# Patient Record
Sex: Male | Born: 1955 | Race: White | Hispanic: No | Marital: Single
Health system: Southern US, Community
[De-identification: ages and names within clinical notes are randomized; demographics above are authoritative.]

## PROBLEM LIST (undated history)

## (undated) DIAGNOSIS — J309 Allergic rhinitis, unspecified: Secondary | ICD-10-CM

## (undated) DIAGNOSIS — E119 Type 2 diabetes mellitus without complications: Secondary | ICD-10-CM

## (undated) DIAGNOSIS — M5430 Sciatica, unspecified side: Secondary | ICD-10-CM

## (undated) DIAGNOSIS — C801 Malignant (primary) neoplasm, unspecified: Secondary | ICD-10-CM

## (undated) DIAGNOSIS — M1990 Unspecified osteoarthritis, unspecified site: Secondary | ICD-10-CM

## (undated) HISTORY — PX: NASAL SEPTUM SURGERY: SHX37

## (undated) HISTORY — DX: Allergic rhinitis, unspecified: J30.9

## (undated) HISTORY — DX: Unspecified osteoarthritis, unspecified site: M19.90

## (undated) HISTORY — PX: BASAL CELL CARCINOMA EXCISION: SHX1214

## (undated) HISTORY — DX: Type 2 diabetes mellitus without complications: E11.9

## (undated) HISTORY — DX: Malignant (primary) neoplasm, unspecified: C80.1

## (undated) HISTORY — PX: RADIAL HEAD EXCISION: SHX757

---

## 1998-01-28 ENCOUNTER — Other Ambulatory Visit: Admission: RE | Admit: 1998-01-28 | Discharge: 1998-01-28 | Payer: Self-pay | Admitting: Otolaryngology

## 2010-09-08 ENCOUNTER — Ambulatory Visit: Payer: Self-pay | Admitting: Family Medicine

## 2010-09-08 ENCOUNTER — Encounter: Payer: Self-pay | Admitting: Family Medicine

## 2010-09-08 DIAGNOSIS — M1990 Unspecified osteoarthritis, unspecified site: Secondary | ICD-10-CM | POA: Insufficient documentation

## 2010-09-08 DIAGNOSIS — J309 Allergic rhinitis, unspecified: Secondary | ICD-10-CM | POA: Insufficient documentation

## 2010-09-08 HISTORY — DX: Unspecified osteoarthritis, unspecified site: M19.90

## 2010-09-08 HISTORY — DX: Allergic rhinitis, unspecified: J30.9

## 2010-09-08 LAB — CONVERTED CEMR LAB
Ketones, urine, test strip: NEGATIVE
Protein, U semiquant: NEGATIVE
Specific Gravity, Urine: 1.02
pH: 5

## 2010-09-09 LAB — CONVERTED CEMR LAB
Albumin: 4 g/dL (ref 3.5–5.2)
Alkaline Phosphatase: 84 units/L (ref 39–117)
BUN: 16 mg/dL (ref 6–23)
Basophils Absolute: 0 10*3/uL (ref 0.0–0.1)
CO2: 27 meq/L (ref 19–32)
Calcium: 8.8 mg/dL (ref 8.4–10.5)
Cholesterol: 188 mg/dL (ref 0–200)
Creatinine, Ser: 0.8 mg/dL (ref 0.4–1.5)
Eosinophils Absolute: 0.1 10*3/uL (ref 0.0–0.7)
GFR calc non Af Amer: 103.97 mL/min (ref >60)
Glucose, Bld: 98 mg/dL (ref 70–99)
HDL: 30.3 mg/dL — ABNORMAL LOW (ref >39.00)
Hemoglobin: 14.3 g/dL (ref 13.0–17.0)
Lymphocytes Relative: 31.6 % (ref 12.0–46.0)
MCHC: 34.6 g/dL (ref 30.0–36.0)
Monocytes Relative: 7.2 % (ref 3.0–12.0)
Neutro Abs: 3.3 10*3/uL (ref 1.4–7.7)
Platelets: 216 10*3/uL (ref 150.0–400.0)
RDW: 13.5 % (ref 11.5–14.6)
TSH: 2.21 microintl units/mL (ref 0.35–5.50)
Total Bilirubin: 0.8 mg/dL (ref 0.3–1.2)
Triglycerides: 130 mg/dL (ref 0.0–149.0)
VLDL: 26 mg/dL (ref 0.0–40.0)

## 2010-09-11 ENCOUNTER — Encounter: Payer: Self-pay | Admitting: Family Medicine

## 2010-09-16 ENCOUNTER — Encounter (INDEPENDENT_AMBULATORY_CARE_PROVIDER_SITE_OTHER): Payer: Self-pay | Admitting: *Deleted

## 2010-10-14 ENCOUNTER — Encounter (INDEPENDENT_AMBULATORY_CARE_PROVIDER_SITE_OTHER): Payer: Self-pay | Admitting: *Deleted

## 2010-10-15 ENCOUNTER — Ambulatory Visit
Admission: RE | Admit: 2010-10-15 | Discharge: 2010-10-15 | Payer: Self-pay | Source: Home / Self Care | Attending: Gastroenterology | Admitting: Gastroenterology

## 2010-10-27 ENCOUNTER — Ambulatory Visit
Admission: RE | Admit: 2010-10-27 | Discharge: 2010-10-27 | Payer: Self-pay | Source: Home / Self Care | Attending: Gastroenterology | Admitting: Gastroenterology

## 2010-10-27 ENCOUNTER — Encounter: Payer: Self-pay | Admitting: Gastroenterology

## 2010-11-04 NOTE — Letter (Signed)
Summary: PATIENT HX FORM  PATIENT HX FORM   Imported By: Georgian Co 09/11/2010 09:35:06  _____________________________________________________________________  External Attachment:    Type:   Image     Comment:   External Document

## 2010-11-04 NOTE — Assessment & Plan Note (Signed)
Summary: new pt/pt is requesting cpx/pt will come in fasting/njr   Vital Signs:  Patient profile:   55 year old male Weight:      261 pounds O2 Sat:      97 % on Room air Temp:     98.6 degrees F oral Pulse rate:   63 / minute BP sitting:   144 / 86  (left arm) Cuff size:   regular  Vitals Entered By: Bill Salinas CMA (September 08, 2010 9:07 AM)  O2 Flow:  Room air CC: New pt here to est care with primary/ ab   History of Present Illness: 55 yr old male to establish with [place] and for a cpx. It has been several years since he has seen a primary care doctor. He sees Dr. Yetta Barre every 6 months, as well as his dentist. He feels fine today and has no concerns.   Preventive Screening-Counseling & Management  Alcohol-Tobacco     Alcohol drinks/day: 0     Smoking Status: never  Current Medications (verified): 1)  Aspirin 325 Mg Tabs (Aspirin) .Marland Kitchen.. 1 Tablet Daily 2)  Zyrtec Hives Relief 10 Mg Tabs (Cetirizine Hcl) .Marland Kitchen.. 1 Tablet Daily 3)  Guaifenesin 200 Mg Tabs (Guaifenesin) .Marland Kitchen.. 1 Tablet Daily  Allergies (verified): No Known Drug Allergies  Past History:  Past Medical History: Allergic rhinitis Osteoarthritis sees Dr. 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 Family History of Arthritis Family History Diabetes 1st degree relative Family History Lung cancer Family History of Prostate CA 1st degree relative <50 father had Parkinsons disease, died at age 20  Social History: Reviewed history and no changes required. Occupation: Airline pilot Married Never Smoked Alcohol use-no Occupation:  employed Smoking Status:  never  Review of Systems  The patient denies anorexia, fever, weight loss, weight gain,  vision loss, decreased hearing, hoarseness, chest pain, syncope, dyspnea on exertion, peripheral edema, prolonged cough, headaches, hemoptysis, abdominal pain, melena, hematochezia, severe indigestion/heartburn, hematuria, incontinence, genital sores, muscle weakness, suspicious skin lesions, transient blindness, difficulty walking, depression, unusual weight change, abnormal bleeding, enlarged lymph nodes, angioedema, breast masses, and testicular masses.    Physical Exam  General:  overweight-appearing.   Head:  Normocephalic and atraumatic without obvious abnormalities. No apparent alopecia or balding. Eyes:  No corneal or conjunctival inflammation noted. EOMI. Perrla. Funduscopic exam benign, without hemorrhages, exudates or papilledema. Vision grossly normal. Ears:  External ear exam shows no significant lesions or deformities.  Otoscopic examination reveals clear canals, tympanic membranes are intact bilaterally without bulging, retraction, inflammation or discharge. Hearing is grossly normal bilaterally. Nose:  External nasal examination shows no deformity or inflammation. Nasal mucosa are pink and moist without lesions or exudates. Mouth:  Oral mucosa and oropharynx without lesions or exudates.  Teeth in good repair. Neck:  No deformities, masses, or tenderness noted. Chest Wall:  No deformities, masses, tenderness or gynecomastia noted. Lungs:  Normal respiratory effort, chest expands symmetrically. Lungs are clear to auscultation, no crackles or wheezes. Heart:  Normal rate and regular rhythm. S1 and S2 normal without gallop, murmur, click, rub or other extra sounds. EKG normal Abdomen:  Bowel sounds positive,abdomen soft and non-tender without masses, organomegaly or hernias noted. Rectal:  No external abnormalities noted. Normal  sphincter tone. No rectal masses or tenderness. heme neg. Genitalia:  Testes bilaterally descended without nodularity, tenderness or masses. No scrotal masses or  lesions. No penis lesions or urethral discharge. Prostate:  no nodules, no asymmetry, no induration, and 1+ enlarged.   Msk:  No deformity or scoliosis noted of thoracic or lumbar spine.   Pulses:  R and L carotid,radial,femoral,dorsalis pedis and posterior tibial pulses are full and equal bilaterally Extremities:  No clubbing, cyanosis, edema, or deformity noted with normal full range of motion of all joints.   Neurologic:  No cranial nerve deficits noted. Station and gait are normal. Plantar reflexes are down-going bilaterally. DTRs are symmetrical throughout. Sensory, motor and coordinative functions appear intact. Skin:  Intact without suspicious lesions or rashes Cervical Nodes:  No lymphadenopathy noted Axillary Nodes:  No palpable lymphadenopathy Inguinal Nodes:  No significant adenopathy Psych:  Cognition and judgment appear intact. Alert and cooperative with normal attention span and concentration. No apparent delusions, illusions, hallucinations   Impression & Recommendations:  Problem # 1:  HEALTH MAINTENANCE EXAM (ICD-V70.0)  Orders: UA Dipstick w/o Micro (automated)  (81003) Hemoccult Guaiac-1 spec.(in office) (82270) EKG w/ Interpretation (93000) Venipuncture (09811) TLB-Lipid Panel (80061-LIPID) TLB-BMP (Basic Metabolic Panel-BMET) (80048-METABOL) TLB-CBC Platelet - w/Differential (85025-CBCD) TLB-Hepatic/Liver Function Pnl (80076-HEPATIC) TLB-TSH (Thyroid Stimulating Hormone) (84443-TSH) TLB-PSA (Prostate Specific Antigen) (84153-PSA) Specimen Handling (91478)  Complete Medication List: 1)  Aspirin 325 Mg Tabs (Aspirin) .Marland Kitchen.. 1 tablet daily 2)  Zyrtec Hives Relief 10 Mg Tabs (Cetirizine hcl) .Marland Kitchen.. 1 tablet daily 3)  Guaifenesin 200 Mg Tabs (Guaifenesin) .Marland Kitchen.. 1 tablet daily  Other Orders: Admin 1st Vaccine (29562) Flu Vaccine 38yrs + (13086)  Patient Instructions: 1)  It is important that you exercise reguarly at least 20 minutes 5 times a week. If you develop  chest pain, have severe difficulty breathing, or feel very tired, stop exercising immediately and seek medical attention.  2)  You need to lose weight. Consider a lower calorie diet and regular exercise.  3)  Schedule a colonoscopy/ sigmoidoscopy to help detect colon cancer.  4)  get fasting labs    Orders Added: 1)  New Patient 40-64 years [99386] 2)  UA Dipstick w/o Micro (automated)  [81003] 3)  Hemoccult Guaiac-1 spec.(in office) [82270] 4)  EKG w/ Interpretation [93000] 5)  Venipuncture [36415] 6)  TLB-Lipid Panel [80061-LIPID] 7)  TLB-BMP (Basic Metabolic Panel-BMET) [80048-METABOL] 8)  TLB-CBC Platelet - w/Differential [85025-CBCD] 9)  TLB-Hepatic/Liver Function Pnl [80076-HEPATIC] 10)  TLB-TSH (Thyroid Stimulating Hormone) [84443-TSH] 11)  TLB-PSA (Prostate Specific Antigen) [57846-NGE] 12)  Specimen Handling [99000] 13)  Admin 1st Vaccine [90471] 14)  Flu Vaccine 64yrs + [95284]   Flu Vaccine Consent Questions     Do you have a history of severe allergic reactions to this vaccine? no    Any prior history of allergic reactions to egg and/or gelatin? no    Do you have a sensitivity to the preservative Thimersol? no    Do you have a past history of Guillan-Barre Syndrome? no    Do you currently have an acute febrile illness? no    Have you ever had a severe reaction to latex? no    Vaccine information given and explained to patient? yes    Are you currently pregnant? no    Lot Number:[id number]   Exp Date:04/04/2011   Site Given  Left Deltoid IM  Influenza Vaccine (to be given today)          .lbflu Laboratory Results  Urine Tests    Routine Urinalysis   Color: yellow Appearance: Clear Glucose: negative   (Normal Range: Negative) Bilirubin: negative   (Normal Range: Negative) Ketone: negative   (Normal Range: Negative) Spec. Gravity: 1.020   (Normal Range: 1.003-1.035) Blood: trace-intact   (Normal Range: Negative) pH: 5.0   (Normal Range:  5.0-8.0) Protein: negative   (Normal Range: Negative) Urobilinogen: 0.2   (Normal Range: 0-1) Nitrite: negative   (Normal Range: Negative) Leukocyte Esterace: negative   (Normal Range: Negative)    Comments: Rita Ohara  September 08, 2010 2:00 PM      Appended Document: colonscopy referral     Clinical Lists Changes  Orders: Added new Referral order of Gastroenterology Referral (GI) - Signed

## 2010-11-06 NOTE — Procedures (Signed)
Summary: Colonoscopy  Patient: Paul Estes Note: All result statuses are Final unless otherwise noted.  Tests: (1) Colonoscopy (COL)   COL Colonoscopy           DONE     [facility]     [address]     [place], [place]  [address]           COLONOSCOPY PROCEDURE REPORT           PATIENT:  [name], [name]  MR#:  [id number]     BIRTHDATE:  07-Feb-1956, 54 yrs. old  GENDER:  male     ENDOSCOPIST:  Vania Rea. Jarold Motto, MD, [facility]     REF. BY:  Tera Mater. Clent Ridges, M.D.     PROCEDURE DATE:  10/27/2010     PROCEDURE:  Average-risk screening colonoscopy     G0121     ASA CLASS:  Class I     INDICATIONS:  family Hx of polyps     MEDICATIONS:   Fentanyl 75 mcg IV, Versed 7 mg IV           DESCRIPTION OF PROCEDURE:   After the risks benefits and     alternatives of the procedure were thoroughly explained, informed     consent was obtained.  Digital rectal exam was performed and     revealed no abnormalities.   The LB 180AL [id number] endoscope was     introduced through the anus and advanced to the cecum, which was     identified by both the appendix and ileocecal valve, without     limitations.  The quality of the prep was excellent, using     MoviPrep.  The instrument was then slowly withdrawn as the colon     was fully examined.     <<PROCEDUREIMAGES>>           FINDINGS:  Scattered diverticula were found in the sigmoid colon.     No polyps or cancers were seen.  This was otherwise a normal     examination of the colon.   Retroflexed views in the rectum     revealed no abnormalities.    The scope was then withdrawn from     the patient and the procedure completed.           COMPLICATIONS:  None     ENDOSCOPIC IMPRESSION:     1) Diverticula, scattered in the sigmoid colon     2) No polyps or cancers     3) Otherwise normal examination     RECOMMENDATIONS:     1) Continue current colorectal screening recommendations for     "routine risk" patients with a repeat colonoscopy in 10  years.     REPEAT EXAM:  No           ______________________________     Vania Rea. Jarold Motto, MD, [facility]           CC:           n.     eSIGNED:   Vania Rea. Jeniah Kishi at 10/27/2010 02:34 PM           Leroy Kennedy, [id number]  Note: An exclamation mark (!) indicates a result that was not dispersed into the flowsheet. Document Creation Date: 10/27/2010 2:34 PM _______________________________________________________________________  (1) Order result status: Final Collection or observation date-time: 10/27/2010 14:25 Requested date-time:  Receipt date-time:  Reported date-time:  Referring Physician:   Ordering Physician: Sheryn Bison [phone]) Specimen Source:  Source:  Launa Grill Order Number: [id number] Lab site:   Appended Document: Colonoscopy    Clinical Lists Changes  Observations: Added new observation of COLONNXTDUE: 10/2020 (10/27/2010 16:47)

## 2010-11-06 NOTE — Letter (Signed)
Summary: Pre Visit Letter Revised  [facility]  [address] [place], [place] [address]   Phone: [phone]  Fax: [phone]        09/16/2010 MRN: [id number] Paul Estes [address] [place], [place]  [address]             Procedure Date:  10/27/2010   Welcome to the Gastroenterology Division at [facility].    You are scheduled to see a nurse for your pre-procedure visit on 10/15/2010 at 9:00AM on the 3rd floor at [facility], [address]  We ask that you try to arrive at our office 15 minutes prior to your appointment time to allow for check-in.  Please take a minute to review the attached form.  If you answer "Yes" to one or more of the questions on the first page, we ask that you call the person listed at your earliest opportunity.  If you answer "No" to all of the questions, please complete the rest of the form and bring it to your appointment.    Your nurse visit will consist of discussing your medical and surgical history, your immediate family medical history, and your medications.   If you are unable to list all of your medications on the form, please bring the medication bottles to your appointment and we will list them.  We will need to be aware of both prescribed and over the counter drugs.  We will need to know exact dosage information as well.    Please be prepared to read and sign documents such as consent forms, a financial agreement, and acknowledgement forms.  If necessary, and with your consent, a friend or relative is welcome to sit-in on the nurse visit with you.  Please bring your insurance card so that we may make a copy of it.  If your insurance requires a referral to see a specialist, please bring your referral form from your primary care physician.  No co-pay is required for this nurse visit.     If you cannot keep your appointment, please call [phone] to cancel or reschedule prior to your appointment date.  This allows  [place] the opportunity to schedule an appointment for another patient in need of care.    Thank you for choosing [facility] for your medical needs.  We appreciate the opportunity to care for you.  Please visit [place] at our website  to learn more about our practice.  Sincerely, The Gastroenterology Division

## 2010-11-06 NOTE — Letter (Signed)
Summary: [facility] Instructions  [facility]  [address] [place], [place] [address]   Phone: [phone]  Fax: [phone]       Paul Estes    [birth year]    MRN: [id number]        Procedure Day Dorna Bloom: Duanne Limerick 10/27/10     Arrival Time: 12:30pm     Procedure Time: 1:30pm     Location of Procedure:                    _X _  [facility] (4th Floor)                        PREPARATION FOR COLONOSCOPY WITH MOVIPREP   Starting 5 days prior to your procedure  [facility] 01/18  do not eat nuts, seeds, popcorn, corn, beans, peas,  salads, or any raw vegetables.  Do not take any fiber supplements (e.g. Metamucil, Citrucel, and Benefiber).  THE DAY BEFORE YOUR PROCEDURE         DATE: SUNDAY 01/22  1.  Drink clear liquids the entire day-NO SOLID FOOD  2.  Do not drink anything colored red or purple.  Avoid juices with pulp.  No orange juice.  3.  Drink at least 64 oz. (8 glasses) of fluid/clear liquids during the day to prevent dehydration and help the prep work efficiently.  CLEAR LIQUIDS INCLUDE: Water Jello Ice Popsicles Tea (sugar ok, no milk/cream) Powdered fruit flavored drinks Coffee (sugar ok, no milk/cream) Gatorade Juice: apple, white grape, white cranberry  Lemonade Clear bullion, consomm, broth Carbonated beverages (any kind) Strained chicken noodle soup Hard Candy                             4.  In the morning, mix first dose of MoviPrep solution:    Empty 1 Pouch A and 1 Pouch B into the disposable container    Add lukewarm drinking water to the top line of the container. Mix to dissolve    Refrigerate (mixed solution should be used within 24 hrs)  5.  Begin drinking the prep at 5:00 p.m. The MoviPrep container is divided by 4 marks.   Every 15 minutes drink the solution down to the next mark (approximately 8 oz) until the full liter is complete.   6.  Follow completed prep with 16 oz of clear liquid of your choice (Nothing  red or purple).  Continue to drink clear liquids until bedtime.  7.  Before going to bed, mix second dose of MoviPrep solution:    Empty 1 Pouch A and 1 Pouch B into the disposable container    Add lukewarm drinking water to the top line of the container. Mix to dissolve    Refrigerate  THE DAY OF YOUR PROCEDURE      DATE: MONDAY  01/23  Beginning at  8:30a.m. (5 hours before procedure):         1. Every 15 minutes, drink the solution down to the next mark (approx 8 oz) until the full liter is complete.  2. Follow completed prep with 16 oz. of clear liquid of your choice.    3. You may drink clear liquids until 11:30am  (2 HOURS BEFORE PROCEDURE).   MEDICATION INSTRUCTIONS  Unless otherwise instructed, you should take regular prescription medications with a small sip of water   as early as possible the morning of your  procedure.           OTHER INSTRUCTIONS  You will need a responsible adult at least 55 years of age to accompany you and drive you home.   This person must remain in the waiting room during your procedure.  Wear loose fitting clothing that is easily removed.  Leave jewelry and other valuables at home.  However, you may wish to bring a book to read or  an iPod/MP3 player to listen to music as you wait for your procedure to start.  Remove all body piercing jewelry and leave at home.  Total time from sign-in until discharge is approximately 2-3 hours.  You should go home directly after your procedure and rest.  You can resume normal activities the  day after your procedure.  The day of your procedure you should not:   Drive   Make legal decisions   Operate machinery   Drink alcohol   Return to work  You will receive specific instructions about eating, activities and medications before you leave.    The above instructions have been reviewed and explained to me by   Ezra Sites RN  October 15, 2010 9:19 AM     I fully understand and can  verbalize these instructions _____________________________ Date _________

## 2010-11-06 NOTE — Miscellaneous (Signed)
Summary: [facility]  Clinical Lists Changes  Medications: Added new medication of MOVIPREP 100 GM  SOLR (PEG-KCL-NACL-NASULF-NA ASC-C) As per prep instructions. - Signed Rx of MOVIPREP 100 GM  SOLR (PEG-KCL-NACL-NASULF-NA ASC-C) As per prep instructions.;  #1 x 0;  Signed;  Entered by: Ezra Sites RN;  Authorized by: Mardella Layman MD [facility];  Method used: Electronically to [facility]*, [address], [place], [place]  [id number], Ph: [phone], Fax: [phone] Observations: Added new observation of NKA: T (10/15/2010 8:53)    Prescriptions: MOVIPREP 100 GM  SOLR (PEG-KCL-NACL-NASULF-NA ASC-C) As per prep instructions.  #1 x 0   Entered by:   Ezra Sites RN   Authorized by:   Mardella Layman MD [facility]   Signed by:   Ezra Sites RN on 10/15/2010   Method used:   Electronically to        [facility]* (retail)       [address]       [place], [place]  [id number]       Ph: [phone]       Fax: [phone]   RxID:   [id number]

## 2011-10-06 HISTORY — PX: SQUAMOUS CELL CARCINOMA EXCISION: SHX2433

## 2012-04-13 ENCOUNTER — Telehealth: Payer: Self-pay | Admitting: Internal Medicine

## 2012-04-13 NOTE — Telephone Encounter (Signed)
Caller: Paul Estes/Patient; PCP: Darryll Capers; CB#: [phone]; Calling today 04/13/12 regarding has bug bite on right buttock.  Onset 04/11/12.  Has purple area with dot in the center about 2 inches in diameter.  Area burns, does not itch.  Area is sore (pt does not think it is a boil).  Afebrile.  Emergent symptoms r/o by Bites and Stings - Insect or Spider guidelines with exception of painful bites.  Appt scheduled for tomorrow 04/14/12 at 2 PM with Jolaine Artist, FNP.  Care advice given.

## 2012-04-14 ENCOUNTER — Ambulatory Visit (INDEPENDENT_AMBULATORY_CARE_PROVIDER_SITE_OTHER): Payer: BC Managed Care – PPO | Admitting: Family

## 2012-04-14 ENCOUNTER — Encounter: Payer: Self-pay | Admitting: Family

## 2012-04-14 VITALS — BP 112/78 | Temp 99.2°F | Ht 73.0 in | Wt 272.0 lb

## 2012-04-14 DIAGNOSIS — M7918 Myalgia, other site: Secondary | ICD-10-CM

## 2012-04-14 DIAGNOSIS — IMO0001 Reserved for inherently not codable concepts without codable children: Secondary | ICD-10-CM

## 2012-04-14 DIAGNOSIS — L0390 Cellulitis, unspecified: Secondary | ICD-10-CM

## 2012-04-14 DIAGNOSIS — L0291 Cutaneous abscess, unspecified: Secondary | ICD-10-CM

## 2012-04-14 MED ORDER — SULFAMETHOXAZOLE-TRIMETHOPRIM 800-160 MG PO TABS: 1.0000 | ORAL_TABLET | Freq: Two times a day (BID) | ORAL | Status: AC

## 2012-04-14 NOTE — Progress Notes (Signed)
  Subjective:    Patient ID: Paul Estes, male    DOB: 20-Apr-1956, 56 y.o.   MRN: [id number]  HPI 56 year old white male, nonsmoker, patient of Dr. Clent Ridges is in today with complaints of a possible insect bite to his right buttocks that he noticed about 3-4 days ago. He describes it as red, tender to touch. Denies any drainage or discharge from the area. Denies fever.   Review of Systems  Constitutional: Negative.   Respiratory: Negative.   Cardiovascular: Negative.   Skin:       Has had bites of the right buttocks, red and tender  Neurological: Negative.   Psychiatric/Behavioral: Negative.    No past medical history on file.  History   Social History  . Marital Status: Married    Spouse Name: N/A    Number of Children: N/A  . Years of Education: N/A   Occupational History  . Not on file.   Social History Main Topics  . Smoking status: Never Smoker   . Smokeless tobacco: Not on file  . Alcohol Use: Yes  . Drug Use: No  . Sexually Active: Not on file   Other Topics Concern  . Not on file   Social History Narrative  . No narrative on file    No past surgical history on file.  No family history on file.  No Known Allergies  Current Outpatient Prescriptions on File Prior to Visit  Medication Sig Dispense Refill  . cetirizine (ZYRTEC) 10 MG tablet Take 10 mg by mouth daily.        BP 112/78  Temp 99.2 F (37.3 C) (Oral)  Ht 6\' 1"  (1.854 m)  Wt 272 lb (123.378 kg)  BMI 35.89 kg/m2chart    Objective:   Physical Exam  Constitutional: He is oriented to person, place, and time. He appears well-developed and well-nourished.  Cardiovascular: Normal rate, regular rhythm and normal heart sounds.   Pulmonary/Chest: Effort normal and breath sounds normal.  Neurological: He is alert and oriented to person, place, and time.  Skin: Skin is warm and dry.       Red, tender, warm area noted to the right buttocks. No drainage or discharge. No abscess. Consistent with  cellulitis.  Psychiatric: He has a normal mood and affect.          Assessment & Plan:  Assessment: Cellulitis, possible insect bite  Plan: Bactrim DS 1 tablet twice a day x7 days. Patient call the office if symptoms worsen or persist. Recheck a schedule, and when necessary.

## 2012-04-14 NOTE — Patient Instructions (Addendum)

## 2012-05-12 ENCOUNTER — Encounter: Payer: Self-pay | Admitting: Family Medicine

## 2012-05-12 ENCOUNTER — Other Ambulatory Visit (INDEPENDENT_AMBULATORY_CARE_PROVIDER_SITE_OTHER): Payer: BC Managed Care – PPO

## 2012-05-12 DIAGNOSIS — Z0000 Encounter for general adult medical examination without abnormal findings: Secondary | ICD-10-CM

## 2012-05-12 LAB — CBC WITH DIFFERENTIAL/PLATELET
Eosinophils Relative: 1.2 % (ref 0.0–5.0)
HCT: 41.4 % (ref 39.0–52.0)
Hemoglobin: 13.8 g/dL (ref 13.0–17.0)
Lymphs Abs: 1.6 10*3/uL (ref 0.7–4.0)
MCV: 90.9 fl (ref 78.0–100.0)
Monocytes Relative: 6.8 % (ref 3.0–12.0)
Neutro Abs: 3.5 10*3/uL (ref 1.4–7.7)
WBC: 5.5 10*3/uL (ref 4.5–10.5)

## 2012-05-12 LAB — LIPID PANEL
Cholesterol: 184 mg/dL (ref 0–200)
Total CHOL/HDL Ratio: 6
Triglycerides: 203 mg/dL — ABNORMAL HIGH (ref 0.0–149.0)
VLDL: 40.6 mg/dL — ABNORMAL HIGH (ref 0.0–40.0)

## 2012-05-12 LAB — POCT URINALYSIS DIPSTICK
Bilirubin, UA: NEGATIVE
Blood, UA: NEGATIVE
Leukocytes, UA: NEGATIVE
Nitrite, UA: NEGATIVE
Protein, UA: NEGATIVE
Urobilinogen, UA: 0.2
pH, UA: 7

## 2012-05-12 LAB — BASIC METABOLIC PANEL
Chloride: 104 mEq/L (ref 96–112)
GFR: 97.8 mL/min (ref >60.00)
Glucose, Bld: 98 mg/dL (ref 70–99)
Potassium: 4.3 mEq/L (ref 3.5–5.1)
Sodium: 138 mEq/L (ref 135–145)

## 2012-05-12 LAB — HEPATIC FUNCTION PANEL
ALT: 21 U/L (ref 0–53)
Bilirubin, Direct: 0.1 mg/dL (ref 0.0–0.3)
Total Bilirubin: 0.8 mg/dL (ref 0.3–1.2)

## 2012-05-16 NOTE — Progress Notes (Signed)
Quick Note:  Attempt to call- VM - LMTCB if questions - gave results and dr. Claris Che instructions ______

## 2012-05-18 ENCOUNTER — Encounter: Payer: Self-pay | Admitting: Family Medicine

## 2012-05-18 ENCOUNTER — Ambulatory Visit (INDEPENDENT_AMBULATORY_CARE_PROVIDER_SITE_OTHER): Payer: BC Managed Care – PPO | Admitting: Family Medicine

## 2012-05-18 VITALS — BP 128/78 | HR 82 | Temp 98.4°F | Resp 20 | Ht 72.5 in | Wt 270.0 lb

## 2012-05-18 DIAGNOSIS — Z0000 Encounter for general adult medical examination without abnormal findings: Secondary | ICD-10-CM

## 2012-05-18 NOTE — Progress Notes (Signed)
  Subjective:    Patient ID: Paul Estes, male    DOB: [birth year], 56 y.o.   MRN: [id number]  HPI 56 yr old male for a cpx. He feels fine and has no concerns except for his weight. He exercises but admits that his diet is not very healthy. He has actually arranged to meet with a Nutritionist starting next week to help with this. His recent labs revealed a low HDL.   Review of Systems  Constitutional: Negative.   HENT: Negative.   Eyes: Negative.   Respiratory: Negative.   Cardiovascular: Negative.   Gastrointestinal: Negative.   Genitourinary: Negative.   Musculoskeletal: Negative.   Skin: Negative.   Neurological: Negative.   Hematological: Negative.   Psychiatric/Behavioral: Negative.        Objective:   Physical Exam  Constitutional: He is oriented to person, place, and time. He appears well-developed and well-nourished. No distress.  HENT:  Head: Normocephalic and atraumatic.  Right Ear: External ear normal.  Left Ear: External ear normal.  Nose: Nose normal.  Mouth/Throat: Oropharynx is clear and moist. No oropharyngeal exudate.  Eyes: Conjunctivae and EOM are normal. Pupils are equal, round, and reactive to light. Right eye exhibits no discharge. Left eye exhibits no discharge. No scleral icterus.  Neck: Neck supple. No JVD present. No tracheal deviation present. No thyromegaly present.  Cardiovascular: Normal rate, regular rhythm, normal heart sounds and intact distal pulses.  Exam reveals no gallop and no friction rub.   No murmur heard.      EKG normal   Pulmonary/Chest: Effort normal and breath sounds normal. No respiratory distress. He has no wheezes. He has no rales. He exhibits no tenderness.  Abdominal: Soft. Bowel sounds are normal. He exhibits no distension and no mass. There is no tenderness. There is no rebound and no guarding.  Genitourinary: Rectum normal, prostate normal and penis normal. Guaiac negative stool. No penile tenderness.  Musculoskeletal: Normal  range of motion. He exhibits no edema and no tenderness.  Lymphadenopathy:    He has no cervical adenopathy.  Neurological: He is alert and oriented to person, place, and time. He has normal reflexes. No cranial nerve deficit. He exhibits normal muscle tone. Coordination normal.  Skin: Skin is warm and dry. No rash noted. He is not diaphoretic. No erythema. No pallor.  Psychiatric: He has a normal mood and affect. His behavior is normal. Judgment and thought content normal.          Assessment & Plan:  Well exam. He will meet with the Nutritionist as above. Advised him to take OTC fish oil capsules daily.

## 2013-11-09 DIAGNOSIS — B351 Tinea unguium: Secondary | ICD-10-CM

## 2014-04-09 ENCOUNTER — Encounter: Payer: Self-pay | Admitting: Family Medicine

## 2014-04-09 ENCOUNTER — Ambulatory Visit (INDEPENDENT_AMBULATORY_CARE_PROVIDER_SITE_OTHER): Payer: BC Managed Care – PPO | Admitting: Family Medicine

## 2014-04-09 VITALS — BP 141/84 | HR 69 | Temp 99.3°F | Ht 72.5 in | Wt 270.0 lb

## 2014-04-09 DIAGNOSIS — N63 Unspecified lump in unspecified breast: Secondary | ICD-10-CM

## 2014-04-09 NOTE — Progress Notes (Signed)
Pre visit review using our clinic review tool, if applicable. No additional management support is needed unless otherwise documented below in the visit note. 

## 2014-04-09 NOTE — Progress Notes (Signed)
   Subjective:    Patient ID: Paul Estes, male    DOB: Feb 23, 1956, 58 y.o.   MRN: [id number]  HPI Here for 2 months of swelling in the left breast. It is slightly tender but no much. No nipple DC. No lumps under the arm. He saw Urgent Care 10 days ago and was put on Keflex, but this did not help. He has not taken any herbs or supplements OTC.    Review of Systems  Constitutional: Negative.   Respiratory: Negative.   Cardiovascular: Negative.   Hematological: Negative for adenopathy.       Objective:   Physical Exam  Constitutional: He appears well-developed and well-nourished.  Pulmonary/Chest:  The left breast is a little swollen and has a density present beneath the nipple, about 3-4 cm in diameter. No axillary nodes          Assessment & Plan:  Unilateral breast swelling. Set up a mammogram and [place] soon.

## 2014-04-19 ENCOUNTER — Ambulatory Visit
Admission: RE | Admit: 2014-04-19 | Discharge: 2014-04-19 | Disposition: A | Discharge status: Home / Self Care | Payer: BC Managed Care – PPO | Source: Ambulatory Visit | Attending: Family Medicine | Admitting: Family Medicine

## 2014-04-19 DIAGNOSIS — N63 Unspecified lump in unspecified breast: Secondary | ICD-10-CM

## 2016-07-20 ENCOUNTER — Encounter: Payer: Self-pay | Admitting: Family Medicine

## 2017-03-29 ENCOUNTER — Ambulatory Visit (INDEPENDENT_AMBULATORY_CARE_PROVIDER_SITE_OTHER): Payer: PRIVATE HEALTH INSURANCE | Admitting: Podiatry

## 2017-03-29 ENCOUNTER — Ambulatory Visit (INDEPENDENT_AMBULATORY_CARE_PROVIDER_SITE_OTHER): Payer: PRIVATE HEALTH INSURANCE

## 2017-03-29 ENCOUNTER — Encounter: Payer: Self-pay | Admitting: Podiatry

## 2017-03-29 VITALS — BP 142/83 | HR 74 | Resp 16 | Ht 72.0 in | Wt 270.0 lb

## 2017-03-29 DIAGNOSIS — M722 Plantar fascial fibromatosis: Secondary | ICD-10-CM

## 2017-03-29 MED ORDER — TRIAMCINOLONE ACETONIDE 10 MG/ML IJ SUSP: 10.0000 mg | Freq: Once | INTRAMUSCULAR | Status: DC

## 2017-03-29 MED ORDER — DICLOFENAC SODIUM 75 MG PO TBEC: 75.0000 mg | DELAYED_RELEASE_TABLET | Freq: Two times a day (BID) | ORAL | 2 refills | Status: DC

## 2017-03-29 NOTE — Patient Instructions (Signed)

## 2017-03-29 NOTE — Progress Notes (Signed)
Subjective:    Patient ID: Paul Estes, male   DOB: 61 y.o.   MRN: [id number]   HPI patient presents stating she has a lot of pain in her right heel at the insertional point of the tendon into the calcaneus and states it's been going on for at least 6 months and he no longer is active as he used to be any needs to lose weight    Review of Systems  All other systems reviewed and are negative.       Objective:  Physical Exam  Cardiovascular: Intact distal pulses.   Musculoskeletal: Normal range of motion.  Neurological: He is alert.  Skin: Skin is warm.  Nursing note and vitals reviewed.  neurovascular status found to be intact with muscle strength adequate range of motion within normal limits with patient found to have exquisite discomfort at the plantar aspect of the right heel insertional point tendon into the calcaneus. Patient is found to have good digital perfusion is well oriented 3 with moderate depression of the arch     Assessment:    Acute plantar fasciitis right with also long-term history of condition which indicates there is a chronic element to this     Plan:  H&P condition reviewed and injected the right plantar fascia 3 mg Kenalog 5 mg Xylocaine and applied fascial brace to lift the arch. I then placed on diclofenac 75 mg twice a day instructed on supportive shoes and gave instructions on physical therapy. Discussed long-term orthotics and reappoint to recheck  X-rays indicate small spur with no indication stress fracture arthritis

## 2017-03-29 NOTE — Progress Notes (Signed)
   Subjective:    Patient ID: Paul Estes, male    DOB: 12-14-1955, 61 y.o.   MRN: [id number]  HPI Chief Complaint  Patient presents with  . Foot Pain    Right foot; bottom of heel & arch; x8 months      Review of Systems  Musculoskeletal: Positive for gait problem.  All other systems reviewed and are negative.      Objective:   Physical Exam        Assessment & Plan:

## 2017-04-12 ENCOUNTER — Ambulatory Visit (INDEPENDENT_AMBULATORY_CARE_PROVIDER_SITE_OTHER): Payer: PRIVATE HEALTH INSURANCE | Admitting: Podiatry

## 2017-04-12 ENCOUNTER — Encounter: Payer: Self-pay | Admitting: Podiatry

## 2017-04-12 DIAGNOSIS — M722 Plantar fascial fibromatosis: Secondary | ICD-10-CM

## 2017-04-12 MED ORDER — TRIAMCINOLONE ACETONIDE 10 MG/ML IJ SUSP
10.0000 mg | Freq: Once | INTRAMUSCULAR | Status: AC
  Administered 2017-04-12: 10 mg

## 2017-04-13 NOTE — Progress Notes (Signed)
Subjective:    Patient ID: Paul Estes, male   DOB: 61 y.o.   MRN: [id number]   HPI patient states the foot is improved but after being on it all day it starts to get sore again and it's still sore in the heel    ROS      Objective:  Physical Exam neurovascular status intact with continued inflammation pain in the right plantar heel at the insertional point of the tendon into the calcaneus with fluid buildup around the medial band     Assessment:   Plantar fasciitis right with inflammation fluid buildup still noted around the medial band with depression of the arch noted      Plan:   H&P x-rays reviewed and today I reinjected the plantar fascia 3 Milligan Kenalog 5 mill grams Xylocaine and then went ahead and dispensed and scanned for orthotics that will be fabricated and patient will be seen back 3 weeks to have those dispensed

## 2017-05-03 ENCOUNTER — Ambulatory Visit (INDEPENDENT_AMBULATORY_CARE_PROVIDER_SITE_OTHER): Payer: Self-pay | Admitting: Podiatry

## 2017-05-03 ENCOUNTER — Encounter: Payer: Self-pay | Admitting: Podiatry

## 2017-05-03 DIAGNOSIS — M722 Plantar fascial fibromatosis: Secondary | ICD-10-CM

## 2017-05-03 NOTE — Patient Instructions (Signed)

## 2017-05-06 NOTE — Progress Notes (Signed)
Subjective:    Patient ID: Paul Estes, male   DOB: 61 y.o.   MRN: [id number]   HPI patient states doing fine    ROS      Objective:  Physical Exam tendinitis     Assessment:    Tendinitis     Plan:     Orthotic dispensed and fit well and reappoint to recheck

## 2020-02-29 ENCOUNTER — Ambulatory Visit: Payer: Self-pay | Attending: Internal Medicine

## 2020-02-29 DIAGNOSIS — Z23 Encounter for immunization: Secondary | ICD-10-CM

## 2020-02-29 NOTE — Progress Notes (Signed)
   Covid-19 Vaccination Clinic  Name:  Seiya Borke    MRN: [id number] DOB: 1956/06/28  02/29/2020  Mr. Tyrie was observed post Covid-19 immunization for 15 minutes without incident. He was provided with Vaccine Information Sheet and instruction to access the V-Safe system.   Mr. Craver was instructed to call 911 with any severe reactions post vaccine: Marland Kitchen Difficulty breathing  . Swelling of face and throat  . A fast heartbeat  . A bad rash all over body  . Dizziness and weakness   Immunizations Administered    Name Date Dose VIS Date Route   JANSSEN COVID-19 VACCINE 02/29/2020 12:11 PM 0.5 mL 12/02/2019 Intramuscular   Manufacturer: [employer]   Lot: :[id number]   [place]: [id number]

## 2020-07-13 ENCOUNTER — Ambulatory Visit (INDEPENDENT_AMBULATORY_CARE_PROVIDER_SITE_OTHER): Payer: 59

## 2020-07-13 ENCOUNTER — Encounter (HOSPITAL_COMMUNITY): Payer: Self-pay

## 2020-07-13 ENCOUNTER — Ambulatory Visit (HOSPITAL_COMMUNITY)
Admission: EM | Admit: 2020-07-13 | Discharge: 2020-07-13 | Disposition: A | Discharge status: Home / Self Care | Payer: 59

## 2020-07-13 ENCOUNTER — Ambulatory Visit (HOSPITAL_COMMUNITY)
Admission: EM | Admit: 2020-07-13 | Discharge: 2020-07-13 | Disposition: A | Discharge status: Still In Hospital | Payer: 59

## 2020-07-13 ENCOUNTER — Other Ambulatory Visit: Payer: Self-pay

## 2020-07-13 DIAGNOSIS — J209 Acute bronchitis, unspecified: Secondary | ICD-10-CM | POA: Diagnosis not present

## 2020-07-13 DIAGNOSIS — R030 Elevated blood-pressure reading, without diagnosis of hypertension: Secondary | ICD-10-CM

## 2020-07-13 DIAGNOSIS — R059 Cough, unspecified: Secondary | ICD-10-CM | POA: Diagnosis not present

## 2020-07-13 MED ORDER — AZITHROMYCIN 250 MG PO TABS: 250.0000 mg | ORAL_TABLET | Freq: Every day | ORAL | 0 refills | Status: DC

## 2020-07-13 NOTE — Discharge Instructions (Addendum)
Take the Zithromax as directed.  Follow up with your primary care provider as directed.    Your blood pressure is elevated today at 166/90.  Please have this rechecked by your primary care provider in 2-4 weeks.

## 2020-07-13 NOTE — ED Triage Notes (Signed)
Pt c/o "chest congestion and a rattle in chest" for approx 4 days ago, productive cough with green sputum, right hip pain acute onset Monday. Reports pain radiates to right leg/thigh, denies any trauma to area.  Pt states was COVID positive on 09/27, URI symptoms started on 09/24.   Had the [place] vaccine in early May.   Denies SOB, CP, fever, n/v.   Last took tylenol at 0430 this morning for hip pain.  Lung sounds coarse bilaterally.

## 2020-07-13 NOTE — ED Provider Notes (Signed)
[facility]    CSN: [id number] Arrival date & time: 07/13/20  1151      History   Chief Complaint Chief Complaint  Patient presents with   Cough   Hip Pain    HPI Paul Estes is a 64 y.o. male.   Patient presents with 4-day history of cough productive of green sputum and chest congestion.  Covid positive on 07/01/2020; symptom onset 06/28/2020.  He denies chest pain, shortness of breath, or other symptoms.  No fever x7 days.  He also reports acute right hip pain x6 days which has begun to radiate down his right leg to his thigh.  No falls or injury.  His medical history includes skin cancer, allergic rhinitis, osteoarthritis.  The history is provided by the patient.    Past Medical History:  Diagnosis Date   ALLERGIC RHINITIS 09/08/2010   Qualifier: Diagnosis of  By: Joyce Gross     Cancer [facility])    skin, sees Dr. Wilhemina Bonito    OSTEOARTHRITIS 09/08/2010   Qualifier: Diagnosis of  By: Joyce Gross      Patient Active Problem List   Diagnosis Date Noted   ALLERGIC RHINITIS 09/08/2010   OSTEOARTHRITIS 09/08/2010    Past Surgical History:  Procedure Laterality Date   BASAL CELL CARCINOMA EXCISION     NASAL SEPTUM SURGERY     RADIAL HEAD EXCISION     SQUAMOUS CELL CARCINOMA EXCISION  2013   from right ear per Dr. Ronnald Ramp        Home Medications    Prior to Admission medications   Medication Sig Start Date End Date Taking? Authorizing Provider  aspirin 325 MG tablet Take 325 mg by mouth daily.   Yes [provider]  fluticasone (FLONASE) 50 MCG/ACT nasal spray Place into both nostrils daily.   Yes [provider]  azithromycin (ZITHROMAX) 250 MG tablet Take 1 tablet (250 mg total) by mouth daily. Take first 2 tablets together, then 1 every day until finished. 07/13/20   Sharion Balloon, NP  cetirizine (ZYRTEC) 10 MG tablet Take 10 mg by mouth daily.    [provider]  diclofenac (VOLTAREN) 75 MG EC tablet Take 1  tablet (75 mg total) by mouth 2 (two) times daily. 03/29/17   Wallene Huh, DPM  guaiFENesin 200 MG tablet Take 400 mg by mouth every 4 (four) hours as needed.    [provider]    Family History Family History  Problem Relation Age of Onset   Arthritis Neg Hx        family hx   Diabetes Neg Hx        family hx   Cancer Neg Hx        lung, prostate   Parkinsonism Neg Hx        family hx    Social History Social History   Tobacco Use   Smoking status: Never Smoker   Smokeless tobacco: Never Used  Substance Use Topics   Alcohol use: No   Drug use: No     Allergies   Patient has no known allergies.   Review of Systems Review of Systems  Constitutional: Negative for chills and fever.  HENT: Negative for ear pain and sore throat.   Eyes: Negative for pain and visual disturbance.  Respiratory: Positive for cough. Negative for shortness of breath.   Cardiovascular: Negative for chest pain and palpitations.  Gastrointestinal: Negative for abdominal pain and vomiting.  Genitourinary: Negative for  dysuria and hematuria.  Musculoskeletal: Positive for arthralgias. Negative for back pain.  Skin: Negative for color change and rash.  Neurological: Negative for seizures, syncope, weakness and numbness.  All other systems reviewed and are negative.    Physical Exam Triage Vital Signs ED Triage Vitals  Enc Vitals Group     BP      Pulse      Resp      Temp      Temp src      SpO2      Weight      Height      Head Circumference      Peak Flow      Pain Score      Pain Loc      Pain Edu?      Excl. in [place]?    No data found.  Updated Vital Signs BP (!) 166/90 (BP Location: Right Arm)    Pulse 88    Temp 98.4 F (36.9 C) (Oral)    Resp 18    SpO2 99%   Visual Acuity Right Eye Distance:   Left Eye Distance:   Bilateral Distance:    Right Eye Near:   Left Eye Near:    Bilateral Near:     Physical Exam Vitals and nursing note reviewed.    Constitutional:      General: He is not in acute distress.    Appearance: He is well-developed.  HENT:     Head: Normocephalic and atraumatic.     Mouth/Throat:     Mouth: Mucous membranes are moist.  Eyes:     Conjunctiva/sclera: Conjunctivae normal.  Cardiovascular:     Rate and Rhythm: Normal rate and regular rhythm.     Heart sounds: Normal heart sounds.  Pulmonary:     Effort: Pulmonary effort is normal. No respiratory distress.     Breath sounds: Rhonchi present.     Comments: Coarse rhonchi scattered throughout. Abdominal:     Palpations: Abdomen is soft.     Tenderness: There is no abdominal tenderness.  Musculoskeletal:        General: No swelling, tenderness, deformity or signs of injury. Normal range of motion.     Cervical back: Neck supple.  Skin:    General: Skin is warm and dry.     Findings: No bruising, erythema, lesion or rash.  Neurological:     General: No focal deficit present.     Mental Status: He is alert and oriented to person, place, and time.     Sensory: No sensory deficit.     Motor: No weakness.     Gait: Gait normal.  Psychiatric:        Mood and Affect: Mood normal.        Behavior: Behavior normal.      UC Treatments / Results  Labs (all labs ordered are listed, but only abnormal results are displayed) Labs Reviewed - No data to display  EKG   Radiology DG Chest 2 View  Result Date: 07/13/2020 CLINICAL DATA:  Productive cough. EXAM: CHEST - 2 VIEW COMPARISON:  None. FINDINGS: The heart size and mediastinal contours are within normal limits. Both lungs are clear. The visualized skeletal structures are unremarkable. IMPRESSION: No active cardiopulmonary disease. Electronically Signed   By: Lovey Newcomer M.D.   On: 07/13/2020 15:27    Procedures Procedures (including critical care time)  Medications Ordered in UC Medications - No data to display  Initial Impression /  Assessment and Plan / UC Course  I have reviewed the triage  vital signs and the nursing notes.  Pertinent labs & imaging results that were available during my care of the patient were reviewed by me and considered in my medical decision making (see chart for details).   Acute bronchitis. Elevated blood pressure reading.  CXR negative.  Based on exam findings, treating with Zithromax.  Instructed patient to follow-up with his PCP next week for recheck of his lungs.  Discussed that his blood pressure is elevated and also needs to be rechecked by his PCP.  Patient agrees to plan of care.   Final Clinical Impressions(s) / UC Diagnoses   Final diagnoses:  Acute bronchitis, unspecified organism  Elevated blood pressure reading     Discharge Instructions     Take the Zithromax as directed.  Follow up with your primary care provider as directed.    Your blood pressure is elevated today at 166/90.  Please have this rechecked by your primary care provider in 2-4 weeks.         ED Prescriptions    Medication Sig Dispense Auth. Provider   azithromycin (ZITHROMAX) 250 MG tablet Take 1 tablet (250 mg total) by mouth daily. Take first 2 tablets together, then 1 every day until finished. 6 tablet Sharion Balloon, NP     PDMP not reviewed this encounter.   Sharion Balloon, NP 07/13/20 1550

## 2020-07-13 NOTE — ED Triage Notes (Signed)
Pt present chest congestion and severe right side hip pain. The hip pain started on Monday night and the chest congestion started 3 days ago. Pt recently tested positive for covid and is out of quarantine

## 2020-07-23 ENCOUNTER — Other Ambulatory Visit: Payer: Self-pay

## 2020-07-23 ENCOUNTER — Ambulatory Visit
Admission: RE | Admit: 2020-07-23 | Discharge: 2020-07-23 | Disposition: A | Discharge status: Home / Self Care | Payer: 59 | Source: Ambulatory Visit | Attending: Emergency Medicine | Admitting: Emergency Medicine

## 2020-07-23 VITALS — BP 177/82 | HR 87 | Temp 97.6°F | Resp 17

## 2020-07-23 DIAGNOSIS — M5441 Lumbago with sciatica, right side: Secondary | ICD-10-CM

## 2020-07-23 DIAGNOSIS — R030 Elevated blood-pressure reading, without diagnosis of hypertension: Secondary | ICD-10-CM

## 2020-07-23 MED ORDER — IBUPROFEN 800 MG PO TABS: 800.0000 mg | ORAL_TABLET | Freq: Three times a day (TID) | ORAL | 0 refills | Status: DC | PRN

## 2020-07-23 MED ORDER — PREDNISONE 10 MG (21) PO TBPK: ORAL_TABLET | Freq: Every day | ORAL | 0 refills | Status: DC

## 2020-07-23 MED ORDER — CYCLOBENZAPRINE HCL 10 MG PO TABS: 10.0000 mg | ORAL_TABLET | Freq: Two times a day (BID) | ORAL | 0 refills | Status: DC | PRN

## 2020-07-23 NOTE — ED Triage Notes (Signed)
Patient reports right sided lower back pain radiating into right hip and right leg. Described as burning constant discomfort that is not relieved by anything.

## 2020-07-23 NOTE — ED Provider Notes (Signed)
[name]    CSN: [id number] Arrival date & time: 07/23/20  1150      History   Chief Complaint Chief Complaint  Patient presents with  . Back Pain    HPI Jeanluc Wegman is a 64 y.o. male.   Patient presents with 2-week history of right lower back pain which radiates down his right lateral thigh.  No falls or injury.  He reports urine "dribbling" for the past couple of weeks; no bowel incontinence.  He denies saddle anesthesia, fever, numbness, weakness, or other symptoms.  Patient was seen here on 07/13/2020; diagnosed with acute bronchitis and elevated blood pressure; treated with Zithromax and instructed patient to follow-up with his PCP for recheck of his blood pressure.  His medical history includes osteoarthritis, allergic rhinitis, skin cancer.  The history is provided by the patient.    Past Medical History:  Diagnosis Date  . ALLERGIC RHINITIS 09/08/2010   Qualifier: Diagnosis of  By: Joyce Gross    . Cancer [facility])    skin, sees Dr. Wilhemina Bonito   . OSTEOARTHRITIS 09/08/2010   Qualifier: Diagnosis of  By: Joyce Gross      Patient Active Problem List   Diagnosis Date Noted  . ALLERGIC RHINITIS 09/08/2010  . OSTEOARTHRITIS 09/08/2010    Past Surgical History:  Procedure Laterality Date  . BASAL CELL CARCINOMA EXCISION    . NASAL SEPTUM SURGERY    . RADIAL HEAD EXCISION    . SQUAMOUS CELL CARCINOMA EXCISION  2013   from right ear per Dr. Ronnald Ramp        Home Medications    Prior to Admission medications   Medication Sig Start Date End Date Taking? Authorizing Provider  aspirin 325 MG tablet Take 325 mg by mouth daily.    [provider]  azithromycin (ZITHROMAX) 250 MG tablet Take 1 tablet (250 mg total) by mouth daily. Take first 2 tablets together, then 1 every day until finished. 07/13/20   Sharion Balloon, NP  cetirizine (ZYRTEC) 10 MG tablet Take 10 mg by mouth daily.    [provider]  diclofenac (VOLTAREN) 75 MG EC tablet  Take 1 tablet (75 mg total) by mouth 2 (two) times daily. 03/29/17   Wallene Huh, DPM  fluticasone (FLONASE) 50 MCG/ACT nasal spray Place into both nostrils daily.    [provider]  guaiFENesin 200 MG tablet Take 400 mg by mouth every 4 (four) hours as needed.    [provider]  ibuprofen (ADVIL) 800 MG tablet Take 1 tablet (800 mg total) by mouth every 8 (eight) hours as needed. 07/23/20   Sharion Balloon, NP  predniSONE (STERAPRED UNI-PAK 21 TAB) 10 MG (21) TBPK tablet Take by mouth daily. As directed 07/23/20   Sharion Balloon, NP    Family History Family History  Problem Relation Age of Onset  . Arthritis Neg Hx        family hx  . Diabetes Neg Hx        family hx  . Cancer Neg Hx        lung, prostate  . Parkinsonism Neg Hx        family hx    Social History Social History   Tobacco Use  . Smoking status: Never Smoker  . Smokeless tobacco: Never Used  Substance Use Topics  . Alcohol use: No  . Drug use: No     Allergies   Patient has no known allergies.  Review of Systems Review of Systems  Constitutional: Negative for chills and fever.  HENT: Negative for ear pain and sore throat.   Eyes: Negative for pain and visual disturbance.  Respiratory: Negative for cough and shortness of breath.   Cardiovascular: Negative for chest pain and palpitations.  Gastrointestinal: Negative for abdominal pain and vomiting.  Genitourinary: Negative for dysuria and hematuria.  Musculoskeletal: Positive for back pain. Negative for arthralgias.  Skin: Negative for color change and rash.  Neurological: Negative for seizures and syncope.  All other systems reviewed and are negative.    Physical Exam Triage Vital Signs ED Triage Vitals  Enc Vitals Group     BP      Pulse      Resp      Temp      Temp src      SpO2      Weight      Height      Head Circumference      Peak Flow      Pain Score      Pain Loc      Pain Edu?      Excl. in [place]?    No  data found.  Updated Vital Signs BP (!) 177/82 (BP Location: Left Arm)   Pulse 87   Temp 97.6 F (36.4 C) (Tympanic)   Resp 17   SpO2 96%   Visual Acuity Right Eye Distance:   Left Eye Distance:   Bilateral Distance:    Right Eye Near:   Left Eye Near:    Bilateral Near:     Physical Exam Vitals and nursing note reviewed.  Constitutional:      General: He is not in acute distress.    Appearance: He is well-developed. He is not ill-appearing.  HENT:     Head: Normocephalic and atraumatic.     Mouth/Throat:     Mouth: Mucous membranes are moist.  Eyes:     Conjunctiva/sclera: Conjunctivae normal.  Cardiovascular:     Rate and Rhythm: Normal rate and regular rhythm.     Heart sounds: No murmur heard.   Pulmonary:     Effort: Pulmonary effort is normal. No respiratory distress.     Breath sounds: Normal breath sounds. No wheezing, rhonchi or rales.  Abdominal:     Palpations: Abdomen is soft.     Tenderness: There is no abdominal tenderness.  Musculoskeletal:        General: No swelling, tenderness, deformity or signs of injury. Normal range of motion.     Cervical back: Neck supple.  Skin:    General: Skin is warm and dry.     Findings: No erythema, lesion or rash.  Neurological:     General: No focal deficit present.     Mental Status: He is alert and oriented to person, place, and time.     Sensory: No sensory deficit.     Motor: No weakness.     Gait: Gait normal.  Psychiatric:        Mood and Affect: Mood normal.        Behavior: Behavior normal.      UC Treatments / Results  Labs (all labs ordered are listed, but only abnormal results are displayed) Labs Reviewed - No data to display  EKG   Radiology No results found.  Procedures Procedures (including critical care time)  Medications Ordered in UC Medications - No data to display  Initial Impression / Assessment and Plan /  UC Course  I have reviewed the triage vital signs and the nursing  notes.  Pertinent labs & imaging results that were available during my care of the patient were reviewed by me and considered in my medical decision making (see chart for details).   Acute right lower back pain with right side sciatica.  Elevated blood pressure reading.  Treating with ibuprofen and Flexeril.  Precautions for drowsiness with Flexeril discussed with patient.  Instructed him to schedule an appointment with orthopedics for follow-up.  Discussed that his blood pressure is elevated today and needs to be rechecked by his PCP in 2 to 4 weeks.  Patient agrees to plan of care.   Final Clinical Impressions(s) / UC Diagnoses   Final diagnoses:  Acute right-sided low back pain with right-sided sciatica  Elevated blood pressure reading     Discharge Instructions     Take the prescribed ibuprofen as needed for your pain.  Take the muscle relaxer Flexeril as needed for muscle spasm; Do not drive, operate machinery, or drink alcohol with this medication as it may make you drowsy.     Schedule a follow-up appointment with the orthopedist listed below or your own orthopedist.  Your blood pressure is elevated today at 177/82.  Please have this rechecked by your primary care provider in 2-4 weeks.         ED Prescriptions    Medication Sig Dispense Auth. Provider   predniSONE (STERAPRED UNI-PAK 21 TAB) 10 MG (21) TBPK tablet Take by mouth daily. As directed 21 tablet Sharion Balloon, NP   ibuprofen (ADVIL) 800 MG tablet Take 1 tablet (800 mg total) by mouth every 8 (eight) hours as needed. 21 tablet Sharion Balloon, NP     PDMP not reviewed this encounter.   Sharion Balloon, NP 07/23/20 1230

## 2020-07-23 NOTE — Discharge Instructions (Addendum)
Take the prescribed ibuprofen as needed for your pain.  Take the muscle relaxer Flexeril as needed for muscle spasm; Do not drive, operate machinery, or drink alcohol with this medication as it may make you drowsy.     Schedule a follow-up appointment with the orthopedist listed below or your own orthopedist.  Your blood pressure is elevated today at 177/82.  Please have this rechecked by your primary care provider in 2-4 weeks.

## 2020-07-26 ENCOUNTER — Encounter (HOSPITAL_BASED_OUTPATIENT_CLINIC_OR_DEPARTMENT_OTHER): Payer: Self-pay

## 2020-07-26 ENCOUNTER — Ambulatory Visit: Payer: Self-pay

## 2020-07-26 ENCOUNTER — Emergency Department (HOSPITAL_BASED_OUTPATIENT_CLINIC_OR_DEPARTMENT_OTHER): Payer: 59

## 2020-07-26 ENCOUNTER — Encounter: Payer: Self-pay | Admitting: Emergency Medicine

## 2020-07-26 ENCOUNTER — Other Ambulatory Visit: Payer: Self-pay

## 2020-07-26 ENCOUNTER — Ambulatory Visit
Admission: EM | Admit: 2020-07-26 | Discharge: 2020-07-26 | Disposition: A | Discharge status: Home / Self Care | Payer: 59

## 2020-07-26 ENCOUNTER — Inpatient Hospital Stay (HOSPITAL_BASED_OUTPATIENT_CLINIC_OR_DEPARTMENT_OTHER)
Admission: EM | Admit: 2020-07-26 | Discharge: 2020-08-01 | DRG: 371 | Disposition: A | Discharge status: Home Health | Payer: 59 | Attending: Internal Medicine | Admitting: Internal Medicine

## 2020-07-26 DIAGNOSIS — Z7982 Long term (current) use of aspirin: Secondary | ICD-10-CM | POA: Diagnosis not present

## 2020-07-26 DIAGNOSIS — Z6831 Body mass index (BMI) 31.0-31.9, adult: Secondary | ICD-10-CM | POA: Diagnosis not present

## 2020-07-26 DIAGNOSIS — N151 Renal and perinephric abscess: Secondary | ICD-10-CM | POA: Diagnosis present

## 2020-07-26 DIAGNOSIS — Z23 Encounter for immunization: Secondary | ICD-10-CM

## 2020-07-26 DIAGNOSIS — M5440 Lumbago with sciatica, unspecified side: Secondary | ICD-10-CM | POA: Diagnosis present

## 2020-07-26 DIAGNOSIS — D649 Anemia, unspecified: Secondary | ICD-10-CM | POA: Diagnosis present

## 2020-07-26 DIAGNOSIS — K6819 Other retroperitoneal abscess: Secondary | ICD-10-CM | POA: Diagnosis present

## 2020-07-26 DIAGNOSIS — Z20822 Contact with and (suspected) exposure to covid-19: Secondary | ICD-10-CM | POA: Diagnosis present

## 2020-07-26 DIAGNOSIS — N5089 Other specified disorders of the male genital organs: Secondary | ICD-10-CM

## 2020-07-26 DIAGNOSIS — E1110 Type 2 diabetes mellitus with ketoacidosis without coma: Secondary | ICD-10-CM

## 2020-07-26 DIAGNOSIS — N433 Hydrocele, unspecified: Secondary | ICD-10-CM | POA: Diagnosis present

## 2020-07-26 DIAGNOSIS — Z85828 Personal history of other malignant neoplasm of skin: Secondary | ICD-10-CM

## 2020-07-26 DIAGNOSIS — J309 Allergic rhinitis, unspecified: Secondary | ICD-10-CM | POA: Diagnosis present

## 2020-07-26 DIAGNOSIS — E871 Hypo-osmolality and hyponatremia: Secondary | ICD-10-CM | POA: Diagnosis present

## 2020-07-26 DIAGNOSIS — T380X5A Adverse effect of glucocorticoids and synthetic analogues, initial encounter: Secondary | ICD-10-CM | POA: Diagnosis present

## 2020-07-26 DIAGNOSIS — R935 Abnormal findings on diagnostic imaging of other abdominal regions, including retroperitoneum: Secondary | ICD-10-CM | POA: Diagnosis present

## 2020-07-26 DIAGNOSIS — R202 Paresthesia of skin: Secondary | ICD-10-CM | POA: Diagnosis present

## 2020-07-26 DIAGNOSIS — Z79899 Other long term (current) drug therapy: Secondary | ICD-10-CM | POA: Diagnosis not present

## 2020-07-26 DIAGNOSIS — R109 Unspecified abdominal pain: Secondary | ICD-10-CM | POA: Diagnosis present

## 2020-07-26 DIAGNOSIS — R200 Anesthesia of skin: Secondary | ICD-10-CM | POA: Diagnosis present

## 2020-07-26 DIAGNOSIS — Z8616 Personal history of COVID-19: Secondary | ICD-10-CM

## 2020-07-26 DIAGNOSIS — E1165 Type 2 diabetes mellitus with hyperglycemia: Secondary | ICD-10-CM | POA: Diagnosis present

## 2020-07-26 DIAGNOSIS — E876 Hypokalemia: Secondary | ICD-10-CM | POA: Diagnosis not present

## 2020-07-26 DIAGNOSIS — R10A0 Flank pain, unspecified side: Secondary | ICD-10-CM | POA: Diagnosis present

## 2020-07-26 DIAGNOSIS — Y929 Unspecified place or not applicable: Secondary | ICD-10-CM | POA: Diagnosis not present

## 2020-07-26 DIAGNOSIS — R630 Anorexia: Secondary | ICD-10-CM | POA: Diagnosis present

## 2020-07-26 DIAGNOSIS — R1031 Right lower quadrant pain: Secondary | ICD-10-CM | POA: Diagnosis not present

## 2020-07-26 DIAGNOSIS — K651 Peritoneal abscess: Secondary | ICD-10-CM

## 2020-07-26 HISTORY — DX: Sciatica, unspecified side: M54.30

## 2020-07-26 LAB — URINALYSIS, ROUTINE W REFLEX MICROSCOPIC
Bilirubin Urine: NEGATIVE
Glucose, UA: >=500 mg/dL — AB
Ketones, ur: >80 mg/dL — AB
Leukocytes,Ua: NEGATIVE
Nitrite: NEGATIVE
Protein, ur: 30 mg/dL — AB
Specific Gravity, Urine: 1.015 (ref 1.005–1.030)
pH: 6 (ref 5.0–8.0)

## 2020-07-26 LAB — CBC WITH DIFFERENTIAL/PLATELET
Abs Immature Granulocytes: 0.33 10*3/uL — ABNORMAL HIGH (ref 0.00–0.07)
Basophils Absolute: 0.1 10*3/uL (ref 0.0–0.1)
Basophils Relative: 0 %
Eosinophils Absolute: 0 10*3/uL (ref 0.0–0.5)
Eosinophils Relative: 0 %
HCT: 32.7 % — ABNORMAL LOW (ref 39.0–52.0)
Hemoglobin: 10.6 g/dL — ABNORMAL LOW (ref 13.0–17.0)
Immature Granulocytes: 1 %
Lymphocytes Relative: 6 %
Lymphs Abs: 1.3 10*3/uL (ref 0.7–4.0)
MCH: 27.4 pg (ref 26.0–34.0)
MCHC: 32.4 g/dL (ref 30.0–36.0)
MCV: 84.5 fL (ref 80.0–100.0)
Monocytes Absolute: 0.6 10*3/uL (ref 0.1–1.0)
Monocytes Relative: 3 %
Neutro Abs: 20.9 10*3/uL — ABNORMAL HIGH (ref 1.7–7.7)
Neutrophils Relative %: 90 %
Platelets: 442 10*3/uL — ABNORMAL HIGH (ref 150–400)
RBC: 3.87 MIL/uL — ABNORMAL LOW (ref 4.22–5.81)
RDW: 13.5 % (ref 11.5–15.5)
WBC: 23.2 10*3/uL — ABNORMAL HIGH (ref 4.0–10.5)
nRBC: 0 % (ref 0.0–0.2)

## 2020-07-26 LAB — COMPREHENSIVE METABOLIC PANEL
ALT: 13 U/L (ref 0–44)
AST: 12 U/L — ABNORMAL LOW (ref 15–41)
Albumin: 2.3 g/dL — ABNORMAL LOW (ref 3.5–5.0)
Alkaline Phosphatase: 122 U/L (ref 38–126)
Anion gap: 15 (ref 5–15)
BUN: 16 mg/dL (ref 8–23)
CO2: 22 mmol/L (ref 22–32)
Calcium: 8.4 mg/dL — ABNORMAL LOW (ref 8.9–10.3)
Chloride: 93 mmol/L — ABNORMAL LOW (ref 98–111)
Creatinine, Ser: 0.71 mg/dL (ref 0.61–1.24)
GFR, Estimated: >60 mL/min (ref >60)
Glucose, Bld: 386 mg/dL — ABNORMAL HIGH (ref 70–99)
Potassium: 3.8 mmol/L (ref 3.5–5.1)
Sodium: 130 mmol/L — ABNORMAL LOW (ref 135–145)
Total Bilirubin: 0.7 mg/dL (ref 0.3–1.2)
Total Protein: 7.2 g/dL (ref 6.5–8.1)

## 2020-07-26 LAB — URINALYSIS, MICROSCOPIC (REFLEX)

## 2020-07-26 LAB — RESPIRATORY PANEL BY RT PCR (FLU A&B, COVID)
Influenza A by PCR: NEGATIVE
Influenza B by PCR: NEGATIVE
SARS Coronavirus 2 by RT PCR: NEGATIVE

## 2020-07-26 LAB — PROTIME-INR
INR: 1.3 — ABNORMAL HIGH (ref 0.8–1.2)
Prothrombin Time: 15.3 seconds — ABNORMAL HIGH (ref 11.4–15.2)

## 2020-07-26 LAB — APTT: aPTT: 28 seconds (ref 24–36)

## 2020-07-26 LAB — LACTIC ACID, PLASMA: Lactic Acid, Venous: 1 mmol/L (ref 0.5–1.9)

## 2020-07-26 LAB — GLUCOSE, CAPILLARY: Glucose-Capillary: 290 mg/dL — ABNORMAL HIGH (ref 70–99)

## 2020-07-26 LAB — LIPASE, BLOOD: Lipase: 18 U/L (ref 11–51)

## 2020-07-26 MED ORDER — HYDROMORPHONE HCL 1 MG/ML IJ SOLN
1.0000 mg | INTRAMUSCULAR | Status: AC | PRN
  Administered 2020-07-26 (×2): 1 mg via INTRAVENOUS
  Filled 2020-07-26 (×2): qty 1

## 2020-07-26 MED ORDER — IOHEXOL 300 MG/ML  SOLN
100.0000 mL | Freq: Once | INTRAMUSCULAR | Status: AC | PRN
  Administered 2020-07-26: 100 mL via INTRAVENOUS

## 2020-07-26 MED ORDER — ACETAMINOPHEN 325 MG PO TABS
650.0000 mg | ORAL_TABLET | Freq: Four times a day (QID) | ORAL | Status: DC | PRN
  Administered 2020-07-27 – 2020-07-29 (×4): 650 mg via ORAL
  Filled 2020-07-26 (×5): qty 2

## 2020-07-26 MED ORDER — INSULIN ASPART 100 UNIT/ML ~~LOC~~ SOLN: 0.0000 [IU] | Freq: Three times a day (TID) | SUBCUTANEOUS | Status: DC

## 2020-07-26 MED ORDER — HYDROMORPHONE HCL 1 MG/ML IJ SOLN
0.5000 mg | INTRAMUSCULAR | Status: AC | PRN
  Administered 2020-07-27: 0.5 mg via INTRAVENOUS
  Filled 2020-07-26: qty 0.5

## 2020-07-26 MED ORDER — SODIUM CHLORIDE 0.9 % IV SOLN: INTRAVENOUS | Status: AC

## 2020-07-26 MED ORDER — ONDANSETRON HCL 4 MG/2ML IJ SOLN
4.0000 mg | Freq: Once | INTRAMUSCULAR | Status: AC
  Administered 2020-07-26: 4 mg via INTRAVENOUS
  Filled 2020-07-26: qty 2

## 2020-07-26 MED ORDER — K PHOS MONO-SOD PHOS DI & MONO 155-852-130 MG PO TABS
500.0000 mg | ORAL_TABLET | Freq: Four times a day (QID) | ORAL | Status: DC
  Filled 2020-07-26: qty 2

## 2020-07-26 MED ORDER — ACETAMINOPHEN 650 MG RE SUPP: 650.0000 mg | Freq: Four times a day (QID) | RECTAL | Status: DC | PRN

## 2020-07-26 MED ORDER — SODIUM CHLORIDE 0.9 % IV SOLN
2.0000 g | Freq: Once | INTRAVENOUS | Status: AC
  Administered 2020-07-26: 2 g via INTRAVENOUS
  Filled 2020-07-26: qty 20

## 2020-07-26 MED ORDER — METRONIDAZOLE IN NACL 5-0.79 MG/ML-% IV SOLN
500.0000 mg | Freq: Once | INTRAVENOUS | Status: AC
  Administered 2020-07-26: 500 mg via INTRAVENOUS
  Filled 2020-07-26: qty 100

## 2020-07-26 MED ORDER — HYDROMORPHONE HCL 1 MG/ML IJ SOLN
0.5000 mg | INTRAMUSCULAR | Status: DC | PRN
  Administered 2020-07-26 (×2): 0.5 mg via INTRAVENOUS
  Filled 2020-07-26: qty 0.5
  Filled 2020-07-26: qty 1

## 2020-07-26 MED ORDER — SODIUM CHLORIDE 0.9 % IV BOLUS
1000.0000 mL | Freq: Once | INTRAVENOUS | Status: AC
  Administered 2020-07-26: 1000 mL via INTRAVENOUS

## 2020-07-26 NOTE — ED Notes (Signed)
Assumed care of patient from Andrea, RN.

## 2020-07-26 NOTE — ED Notes (Signed)
Patient transported to CT 

## 2020-07-26 NOTE — Discharge Instructions (Addendum)
Go to the ER for further evaluation and treatment. 

## 2020-07-26 NOTE — ED Provider Notes (Signed)
[facility] EMERGENCY DEPARTMENT Provider Note   CSN: [id number] Arrival date & time: 07/26/20  1148     History Chief Complaint  Patient presents with  . Abdominal Pain    Paul Estes is a 64 y.o. male.  64 year old male presents with complaint of right lower quadrant abdominal pain onset yesterday.  Pain is constant, does not radiate, is worse with movement or any activity.  Reports loss of appetite today.  Denies fevers, chills.  Last bowel movement was 2 days ago, states he normally has a bowel movement daily.  No changes in urinary habits.  No prior abdominal surgeries.  No other complaints or concerns.        Past Medical History:  Diagnosis Date  . ALLERGIC RHINITIS 09/08/2010   Qualifier: Diagnosis of  By: Joyce Gross    . Cancer [facility])    skin, sees Dr. Wilhemina Bonito   . OSTEOARTHRITIS 09/08/2010   Qualifier: Diagnosis of  By: Joyce Gross    . Sciatica     Patient Active Problem List   Diagnosis Date Noted  . Abnormal CT of the abdomen 07/26/2020  . ALLERGIC RHINITIS 09/08/2010  . OSTEOARTHRITIS 09/08/2010    Past Surgical History:  Procedure Laterality Date  . BASAL CELL CARCINOMA EXCISION    . NASAL SEPTUM SURGERY    . RADIAL HEAD EXCISION    . SQUAMOUS CELL CARCINOMA EXCISION  2013   from right ear per Dr. Ronnald Ramp        Family History  Problem Relation Age of Onset  . Arthritis Neg Hx        family hx  . Diabetes Neg Hx        family hx  . Cancer Neg Hx        lung, prostate  . Parkinsonism Neg Hx        family hx    Social History   Tobacco Use  . Smoking status: Never Smoker  . Smokeless tobacco: Never Used  Substance Use Topics  . Alcohol use: No  . Drug use: No    Home Medications Prior to Admission medications   Medication Sig Start Date End Date Taking? Authorizing Provider  aspirin 325 MG tablet Take 325 mg by mouth daily.    [provider]  azithromycin (ZITHROMAX) 250 MG tablet Take 1 tablet (250  mg total) by mouth daily. Take first 2 tablets together, then 1 every day until finished. 07/13/20   Sharion Balloon, NP  cetirizine (ZYRTEC) 10 MG tablet Take 10 mg by mouth daily.    [provider]  cyclobenzaprine (FLEXERIL) 10 MG tablet Take 1 tablet (10 mg total) by mouth 2 (two) times daily as needed for muscle spasms. 07/23/20   Sharion Balloon, NP  diclofenac (VOLTAREN) 75 MG EC tablet Take 1 tablet (75 mg total) by mouth 2 (two) times daily. 03/29/17   Wallene Huh, DPM  fluticasone (FLONASE) 50 MCG/ACT nasal spray Place into both nostrils daily.    [provider]  guaiFENesin 200 MG tablet Take 400 mg by mouth every 4 (four) hours as needed.    [provider]  ibuprofen (ADVIL) 800 MG tablet Take 1 tablet (800 mg total) by mouth every 8 (eight) hours as needed. 07/23/20   Sharion Balloon, NP  predniSONE (STERAPRED UNI-PAK 21 TAB) 10 MG (21) TBPK tablet Take by mouth daily. As directed 07/23/20   Sharion Balloon, NP    Allergies  Patient has no known allergies.  Review of Systems   Review of Systems  Constitutional: Positive for appetite change. Negative for chills and fever.  Respiratory: Negative for shortness of breath.   Cardiovascular: Negative for chest pain.  Gastrointestinal: Positive for abdominal pain and constipation. Negative for blood in stool, diarrhea, nausea and vomiting.  Genitourinary: Negative for difficulty urinating, frequency and hematuria.  Musculoskeletal: Negative for arthralgias, back pain and myalgias.  Skin: Negative for rash and wound.  Allergic/Immunologic: Negative for immunocompromised state.  Neurological: Negative for weakness and numbness.  All other systems reviewed and are negative.   Physical Exam Updated Vital Signs BP (!) 141/76 (BP Location: Right Arm)   Pulse 88   Temp 99.1 F (37.3 C) (Oral)   Resp 18   Ht 6' (1.829 m)   Wt 106.1 kg   SpO2 93%   BMI 31.74 kg/m   Physical Exam Vitals and nursing note  reviewed.  Constitutional:      General: He is not in acute distress.    Appearance: He is well-developed. He is obese. He is not diaphoretic.  HENT:     Head: Normocephalic and atraumatic.  Cardiovascular:     Rate and Rhythm: Normal rate and regular rhythm.     Heart sounds: Normal heart sounds.  Pulmonary:     Effort: Pulmonary effort is normal.  Abdominal:     General: Bowel sounds are decreased.     Palpations: Abdomen is soft.     Tenderness: There is abdominal tenderness in the right lower quadrant. There is guarding.  Skin:    General: Skin is warm and dry.     Findings: No erythema or rash.  Neurological:     Mental Status: He is alert and oriented to person, place, and time.  Psychiatric:        Behavior: Behavior normal.     ED Results / Procedures / Treatments   Labs (all labs ordered are listed, but only abnormal results are displayed) Labs Reviewed  CBC WITH DIFFERENTIAL/PLATELET - Abnormal; Notable for the following components:      Result Value   WBC 23.2 (*)    RBC 3.87 (*)    Hemoglobin 10.6 (*)    HCT 32.7 (*)    Platelets 442 (*)    Neutro Abs 20.9 (*)    Abs Immature Granulocytes 0.33 (*)    All other components within normal limits  COMPREHENSIVE METABOLIC PANEL - Abnormal; Notable for the following components:   Sodium 130 (*)    Chloride 93 (*)    Glucose, Bld 386 (*)    Calcium 8.4 (*)    Albumin 2.3 (*)    AST 12 (*)    All other components within normal limits  URINALYSIS, ROUTINE W REFLEX MICROSCOPIC - Abnormal; Notable for the following components:   Glucose, UA >=500 (*)    Hgb urine dipstick SMALL (*)    Ketones, ur >80 (*)    Protein, ur 30 (*)    All other components within normal limits  URINALYSIS, MICROSCOPIC (REFLEX) - Abnormal; Notable for the following components:   Bacteria, UA RARE (*)    All other components within normal limits  PROTIME-INR - Abnormal; Notable for the following components:   Prothrombin Time 15.3 (*)     INR 1.3 (*)    All other components within normal limits  RESPIRATORY PANEL BY RT PCR (FLU A&B, COVID)  CULTURE, BLOOD (ROUTINE X 2)  CULTURE, BLOOD (ROUTINE X  2)  URINE CULTURE  LIPASE, BLOOD  APTT  LACTIC ACID, PLASMA  LACTIC ACID, PLASMA  I-STAT CHEM 8, ED    EKG None  Radiology CT Abdomen Pelvis W Contrast  Result Date: 07/26/2020 CLINICAL DATA:  Right lower quadrant abdominal pain EXAM: CT ABDOMEN AND PELVIS WITH CONTRAST TECHNIQUE: Multidetector CT imaging of the abdomen and pelvis was performed using the standard protocol following bolus administration of intravenous contrast. CONTRAST:  173mL OMNIPAQUE IOHEXOL 300 MG/ML  SOLN COMPARISON:  None. FINDINGS: Lower chest: Visualized lung bases are clear. Visualized heart and pericardium are unremarkable peer Hepatobiliary: Mild hepatomegaly. No focal liver lesion. No intra or extrahepatic biliary ductal dilation. Gallbladder unremarkable. Pancreas: Unremarkable. Spleen: Moderate splenomegaly with the spleen measuring 17 cm in greatest dimension. No intrasplenic lesion identified. The splenic vein is patent. Adrenals/Urinary Tract: The adrenal glands are unremarkable. There is a heterogeneously enhancing collection seen within the posterior right pararenal space intimately associated with the posterior, inferior pole of the left kidney which demonstrates hypoenhancement on delayed images. This measures 9.2 x 5.0 cm on axial image # 38. There is no extravasation of contrast noted on delayed images. Together, the findings may reflect focal pyelonephritis with retroperitoneal abscess formation, retroperitoneal hemorrhage secondary to an underlying renal mass, or a retroperitoneal urinoma, though the latter is considered less likely given the lack of contrast extravasation. There is extension of this inflammatory or hemorrhagic process into the retroperitoneal space lateral to the right kidney and subjacent to the transversus abdominus  musculature. Hypoenhancement of the lower pole of the right kidney is noted on delayed images. The kidneys are otherwise normal in size and position and demonstrate normal cortical enhancement. Simple cortical cyst within the left kidney. No hydronephrosis. No intrarenal or ureteral calculi. Bladder unremarkable. Stomach/Bowel: The stomach, small bowel, and large bowel are unremarkable. Appendix normal. No free intraperitoneal gas or fluid. Vascular/Lymphatic: Shotty pericaval adenopathy is present, likely reactive. No frankly pathologic adenopathy within the abdomen and pelvis. The abdominal vasculature is unremarkable. Reproductive: Prostate is unremarkable. Other: Rectum unremarkable. Musculoskeletal: No acute bone abnormality. IMPRESSION: Irregularly enhancing collection within the right posterior pararenal space intimately associated with the posteroinferior pole of the right kidney extending into the right psoas and subsequently into the retroperitoneal space subjacent to the right transverse abdominus musculature. Differential considerations include hemorrhage related to an underlying renal mass, a retroperitoneal abscess related to focal pyonephrosis, or a chronic urinoma though no definite leakage of urine is seen at this time on delayed images. There is associated hypoenhancement of a the adjacent renal cortex again favoring a renal etiology. Among the differential considerations, retroperitoneal abscess is considered most likely. Exclusion of an underlying mass could be performed with dedicated renal mass protocol CT or MRI imaging. Electronically Signed   By: Fidela Salisbury MD   On: 07/26/2020 16:01    Procedures Procedures (including critical care time)  Medications Ordered in ED Medications  HYDROmorphone (DILAUDID) injection 1 mg (1 mg Intravenous Given 07/26/20 1303)  sodium chloride 0.9 % bolus 1,000 mL (0 mLs Intravenous Stopped 07/26/20 1443)  ondansetron (ZOFRAN) injection 4 mg (4 mg  Intravenous Given 07/26/20 1303)  cefTRIAXone (ROCEPHIN) 2 g in sodium chloride 0.9 % 100 mL IVPB (0 g Intravenous Stopped 07/26/20 1444)    And  metroNIDAZOLE (FLAGYL) IVPB 500 mg (0 mg Intravenous Stopped 07/26/20 1653)  iohexol (OMNIPAQUE) 300 MG/ML solution 100 mL (100 mLs Intravenous Contrast Given 07/26/20 1534)    ED Course  I have reviewed the triage  vital signs and the nursing notes.  Pertinent labs & imaging results that were available during my care of the patient were reviewed by me and considered in my medical decision making (see chart for details).  Clinical Course as of Jul 26 1733  Fri Oct 22, 333  3612 63 year old male presents with complaint of right lower quadrant pain since yesterday with loss of appetite.  Pain worse with movement and palpation.  On exam found to have guarding with right lower quadrant tenderness and hypoactive bowel sounds. Vitals reviewed, patient is mildly hypertensive but otherwise not tachycardic or tachypneic with a max temp of 99.1%. Labs returned with a leukocytosis of 23,000, presumed intra-abdominal infection and patient was given Flagyl and Rocephin while awaiting CT results.  CMP with glucose of 386 in this patient with no documented history of diabetes.  Urinalysis returns with greater than 500 glucose as well as ketones and protein.  Lipase within normal meds. CT abdomen pelvis returns with concern for retroperitoneal abscess involving the right kidney. Case was discussed with Dr. Lovena Neighbours, on-call with urology.  CT and labs reviewed, recommends admit to hospitalist for IV antibiotics and IR consult for drain.  States patient does not need surgery from a urology perspective at this time.  Requests add on PT/INR and admission to [facility].  We will also trend lactic. Case discussed with Dr. Florene Glen with [facility] hospitalist service who will consult for admission, request urine culture as well as A1c and sliding scale insulin.   [LM]    Clinical  Course User Index [LM] Roque Lias   MDM Rules/Calculators/A&P                          Final Clinical Impression(s) / ED Diagnoses Final diagnoses:  Renal abscess, right    Rx / DC Orders ED Discharge Orders    None       Roque Lias 07/26/20 1734    Quintella Reichert, MD 07/27/20 1051

## 2020-07-26 NOTE — Treatment Plan (Addendum)
64 yo who presented with abdominal pain.  Imaging nottable for irregularly enhancing collection within the R posterior pararenal space associated with the posteroinferior pole of R kidney extending into the R psoas and subsequently into the retroperitoneal space subjacent to the R transverse abdominus muscle.  Per rads, retroperitoneal abscess is most likely among differential considerations.  Consider renal mass protocol CT or MRI to exclude underlying mass.  EDP discussed with Dr. Lovena Neighbours who recommended hospitalist admission and IR for drainage.  Vitals notable for elevated BP.  Labs notable for leukocytosis, anemia, hyponatremia, and hyperglycemia.  Will admit to med tele.  Requested A1c and SSI.  Follow urine cx and blood cx as well.  S/p ceftriaxone/flagyl.

## 2020-07-26 NOTE — ED Triage Notes (Addendum)
Patient c/o RT sided ABD pain since yesterday.   Patient denies N/V or diarrhea.   Patient stated the site is painful to touch.   Pain 08/10.   Patient denies any history of ABD issues.   Patient denies trying any methods for pain management at home.

## 2020-07-26 NOTE — ED Provider Notes (Signed)
[facility]   [id number] 07/26/20 Arrival Time: 1033  CC: ABDOMINAL PAIN  SUBJECTIVE:  Paul Estes is a 64 y.o. male who presents with complaint of abdominal discomfort that began abruptly yesterday. Denies a precipitating event, trauma, close contacts with similar symptoms, recent travel or antibiotic use. Localizes pain to RLQ. Describes as sharp and consistent in character. Has not taken OTC medications for this. Denies alleviating or aggravating factors. Denies similar symptoms in the past. Last BM 2 days ago. Reports that he usually has a BM daily. States that he still has his appendix. Reports that pain is 8/10.  Denies fever, chills, appetite changes, weight changes, nausea, vomiting, chest pain, SOB, diarrhea, constipation, hematochezia, melena, dysuria, difficulty urinating, increased frequency or urgency, flank pain, loss of bowel or bladder function  ROS: As per HPI.  All other pertinent ROS negative.     Past Medical History:  Diagnosis Date  . ALLERGIC RHINITIS 09/08/2010   Qualifier: Diagnosis of  By: Joyce Gross    . Cancer [facility])    skin, sees Dr. Wilhemina Bonito   . OSTEOARTHRITIS 09/08/2010   Qualifier: Diagnosis of  By: Joyce Gross     Past Surgical History:  Procedure Laterality Date  . BASAL CELL CARCINOMA EXCISION    . NASAL SEPTUM SURGERY    . RADIAL HEAD EXCISION    . SQUAMOUS CELL CARCINOMA EXCISION  2013   from right ear per Dr. Ronnald Ramp    No Known Allergies Current Facility-Administered Medications on File Prior to Encounter  Medication Dose Route Frequency Provider Last Rate Last Admin  . triamcinolone acetonide (KENALOG) 10 MG/ML injection 10 mg  10 mg Other Once Wallene Huh, DPM       Current Outpatient Medications on File Prior to Encounter  Medication Sig Dispense Refill  . cyclobenzaprine (FLEXERIL) 10 MG tablet Take 1 tablet (10 mg total) by mouth 2 (two) times daily as needed for muscle spasms. 20 tablet 0  . ibuprofen (ADVIL)  800 MG tablet Take 1 tablet (800 mg total) by mouth every 8 (eight) hours as needed. 21 tablet 0  . predniSONE (STERAPRED UNI-PAK 21 TAB) 10 MG (21) TBPK tablet Take by mouth daily. As directed 21 tablet 0  . aspirin 325 MG tablet Take 325 mg by mouth daily.    Marland Kitchen azithromycin (ZITHROMAX) 250 MG tablet Take 1 tablet (250 mg total) by mouth daily. Take first 2 tablets together, then 1 every day until finished. 6 tablet 0  . cetirizine (ZYRTEC) 10 MG tablet Take 10 mg by mouth daily.    . diclofenac (VOLTAREN) 75 MG EC tablet Take 1 tablet (75 mg total) by mouth 2 (two) times daily. 50 tablet 2  . fluticasone (FLONASE) 50 MCG/ACT nasal spray Place into both nostrils daily.    Marland Kitchen guaiFENesin 200 MG tablet Take 400 mg by mouth every 4 (four) hours as needed.     Social History   Socioeconomic History  . Marital status: Married    Spouse name: Not on file  . Number of children: Not on file  . Years of education: Not on file  . Highest education level: Not on file  Occupational History  . Not on file  Tobacco Use  . Smoking status: Never Smoker  . Smokeless tobacco: Never Used  Substance and Sexual Activity  . Alcohol use: No  . Drug use: No  . Sexual activity: Not on file  Other Topics Concern  . Not on file  Social  History Narrative  . Not on file   Social Determinants of Health   Financial Resource Strain:   . Difficulty of Paying Living Expenses: Not on file  Food Insecurity:   . Worried About Charity fundraiser in the Last Year: Not on file  . Ran Out of Food in the Last Year: Not on file  Transportation Needs:   . Lack of Transportation (Medical): Not on file  . Lack of Transportation (Non-Medical): Not on file  Physical Activity:   . Days of Exercise per Week: Not on file  . Minutes of Exercise per Session: Not on file  Stress:   . Feeling of Stress : Not on file  Social Connections:   . Frequency of Communication with Friends and Family: Not on file  . Frequency of  Social Gatherings with Friends and Family: Not on file  . Attends Religious Services: Not on file  . Active Member of Clubs or Organizations: Not on file  . Attends Archivist Meetings: Not on file  . Marital Status: Not on file  Intimate Partner Violence:   . Fear of Current or Ex-Partner: Not on file  . Emotionally Abused: Not on file  . Physically Abused: Not on file  . Sexually Abused: Not on file   Family History  Problem Relation Age of Onset  . Arthritis Neg Hx        family hx  . Diabetes Neg Hx        family hx  . Cancer Neg Hx        lung, prostate  . Parkinsonism Neg Hx        family hx     OBJECTIVE:  Vitals:   07/26/20 1036 07/26/20 1039  BP:  (!) 157/77  Pulse:  93  Resp:  16  Temp:  98.4 F (36.9 C)  TempSrc:  Oral  SpO2:  95%  Weight: 240 lb (108.9 kg)   Height: 6' (1.829 m)     General appearance: Alert; NAD HEENT: NCAT.  Oropharynx clear.  Lungs: clear to auscultation bilaterally without adventitious breath sounds Heart: regular rate and rhythm.  Radial pulses 2+ symmetrical bilaterally Abdomen: RLQ very tender even to light palpation; normal active bowel sounds; tender at McBurney's point; negative Murphy's sign; negative rebound; no guarding Back: no CVA tenderness Extremities: no edema; symmetrical with no gross deformities Skin: warm and dry Neurologic: normal gait Psychological: alert and cooperative; normal mood and affect  LABS: No results found for this or any previous visit (from the past 24 hour(s)).  DIAGNOSTIC STUDIES: No results found.   ASSESSMENT & PLAN:  1. RLQ abdominal pain    Would be best served in the ER given his pain severity and clinical presentation Cannot rule out appendicitis or bowel issue without further imaging Verbalized understanding and will go to ER via personal vehicle   Faustino Congress, NP 07/26/20 1111

## 2020-07-26 NOTE — ED Notes (Signed)
Pt departs ED with [facility] at this time.

## 2020-07-26 NOTE — H&P (Signed)
History and Physical    Mccauley Diehl GYK:[id number] DOB: May 14, 1956 DOA: 07/26/2020  PCP: [facility] [place]  Patient coming from: Home.  Chief Complaint: Abdominal pain.  HPI: Paul Estes is a 64 y.o. male with no significant past medical history was recently placed on steroids for low back pain and sciatica with Flexeril 3 days ago was been experiencing low back pain with radiation to right lower extremity for the last 3 weeks suddenly start developing right flank pain with swelling over the last 24 hours with no associated nausea vomiting diarrhea or fever chills.  Patient felt that was a small swelling in the right lower quadrant.  Patient presents to the ER admits in [facility].  ED Course: In the ER patient was mildly febrile with temperature 100 F labs were significant for leukocytosis of 23,000 with anemia hemoglobin of 10 blood glucose of 386 sodium 130 Covid test was negative CT scan of the abdomen pelvis done shows features concerning for enhancing collection in the right pararenal space family concerning for retroperitoneal abscess for which Dr. Lovena Neighbours of urology was consulted requested interventional radiology consult for drain placement.  Patient had blood cultures drawn and started on empiric antibiotics fluids and admitted for further management.  UA showing WBC of 21-50 with rare bacteria ketones and blood sugar.  Review of Systems: As per HPI, rest all negative.   Past Medical History:  Diagnosis Date  . ALLERGIC RHINITIS 09/08/2010   Qualifier: Diagnosis of  By: Joyce Gross    . Cancer [facility]    skin, sees Dr. Wilhemina Bonito   . OSTEOARTHRITIS 09/08/2010   Qualifier: Diagnosis of  By: Joyce Gross    . Sciatica     Past Surgical History:  Procedure Laterality Date  . BASAL CELL CARCINOMA EXCISION    . NASAL SEPTUM SURGERY    . RADIAL HEAD EXCISION    . SQUAMOUS CELL CARCINOMA EXCISION  2013   from right ear per Dr. Ronnald Ramp      reports  that he has never smoked. He has never used smokeless tobacco. He reports that he does not drink alcohol and does not use drugs.  No Known Allergies  Family History  Problem Relation Age of Onset  . Arthritis Neg Hx        family hx  . Diabetes Neg Hx        family hx  . Cancer Neg Hx        lung, prostate  . Parkinsonism Neg Hx        family hx    Prior to Admission medications   Medication Sig Start Date End Date Taking? Authorizing Provider  cetirizine (ZYRTEC) 10 MG tablet Take 10 mg by mouth daily.   Yes [provider]  cyclobenzaprine (FLEXERIL) 10 MG tablet Take 1 tablet (10 mg total) by mouth 2 (two) times daily as needed for muscle spasms. 07/23/20  Yes Sharion Balloon, NP  fluticasone (FLONASE) 50 MCG/ACT nasal spray Place into both nostrils daily.   Yes [provider]  ibuprofen (ADVIL) 800 MG tablet Take 1 tablet (800 mg total) by mouth every 8 (eight) hours as needed. 07/23/20  Yes Sharion Balloon, NP  predniSONE (STERAPRED UNI-PAK 21 TAB) 10 MG (21) TBPK tablet Take by mouth daily. As directed 07/23/20  Yes Sharion Balloon, NP  aspirin 325 MG tablet Take 325 mg by mouth daily. Patient not taking: Reported on 07/26/2020    [provider]  azithromycin (ZITHROMAX) 250 MG tablet Take 1 tablet (250 mg total) by mouth daily. Take first 2 tablets together, then 1 every day until finished. Patient not taking: Reported on 07/26/2020 07/13/20   Sharion Balloon, NP  diclofenac (VOLTAREN) 75 MG EC tablet Take 1 tablet (75 mg total) by mouth 2 (two) times daily. Patient not taking: Reported on 07/26/2020 03/29/17   Wallene Huh, DPM    Physical Exam: Constitutional: Moderately built and nourished. Vitals:   07/26/20 1900 07/26/20 2000 07/26/20 2100 07/26/20 2246  BP: (!) 149/63 (!) 141/57 128/68 (!) 163/86  Pulse: 92 91 94 96  Resp: 18 18 18 18   Temp:    100.1 F (37.8 C)  TempSrc:    Oral  SpO2: 94% 94% 95% 91%  Weight:      Height:       Eyes:  Anicteric no pallor. ENMT: No discharge from the ears eyes nose or mouth. Neck: No mass felt.  No neck rigidity. Respiratory: No rhonchi or crepitations. Cardiovascular: S1-S2 heard. Abdomen: Abdomen is diffusely tender more on the right side particularly in the right flank and right lower quadrant with no rigidity. Musculoskeletal: No edema. Skin: No rash. Neurologic: Alert awake oriented to time place and person.  Moves all extremities. Psychiatric: Appears normal.  Normal affect.   Labs on Admission: I have personally reviewed following labs and imaging studies  CBC: Recent Labs  Lab 07/26/20 1243  WBC 23.2*  NEUTROABS 20.9*  HGB 10.6*  HCT 32.7*  MCV 84.5  PLT 694*   Basic Metabolic Panel: Recent Labs  Lab 07/26/20 1243  NA 130*  K 3.8  CL 93*  CO2 22  GLUCOSE 386*  BUN 16  CREATININE 0.71  CALCIUM 8.4*   GFR: Estimated Creatinine Clearance: 117.4 mL/min (by C-G formula based on SCr of 0.71 mg/dL). Liver Function Tests: Recent Labs  Lab 07/26/20 1243  AST 12*  ALT 13  ALKPHOS 122  BILITOT 0.7  PROT 7.2  ALBUMIN 2.3*   Recent Labs  Lab 07/26/20 1243  LIPASE 18   No results for input(s): AMMONIA in the last 168 hours. Coagulation Profile: Recent Labs  Lab 07/26/20 1701  INR 1.3*   Cardiac Enzymes: No results for input(s): CKTOTAL, CKMB, CKMBINDEX, TROPONINI in the last 168 hours. BNP (last 3 results) No results for input(s): PROBNP in the last 8760 hours. HbA1C: No results for input(s): HGBA1C in the last 72 hours. CBG: No results for input(s): GLUCAP in the last 168 hours. Lipid Profile: No results for input(s): CHOL, HDL, LDLCALC, TRIG, CHOLHDL, LDLDIRECT in the last 72 hours. Thyroid Function Tests: No results for input(s): TSH, T4TOTAL, FREET4, T3FREE, THYROIDAB in the last 72 hours. Anemia Panel: No results for input(s): VITAMINB12, FOLATE, FERRITIN, TIBC, IRON, RETICCTPCT in the last 72 hours. Urine analysis:    Component Value  Date/Time   COLORURINE YELLOW 07/26/2020 [place] 07/26/2020 1244   LABSPEC 1.015 07/26/2020 1244   PHURINE 6.0 07/26/2020 1244   GLUCOSEU >=500 (A) 07/26/2020 1244   HGBUR SMALL (A) 07/26/2020 1244   HGBUR trace-intact 09/08/2010 0826   BILIRUBINUR NEGATIVE 07/26/2020 1244   BILIRUBINUR n 05/12/2012 0950   KETONESUR >80 (A) 07/26/2020 1244   PROTEINUR 30 (A) 07/26/2020 1244   UROBILINOGEN 0.2 05/12/2012 0950   UROBILINOGEN 0.2 09/08/2010 0826   NITRITE NEGATIVE 07/26/2020 1244   LEUKOCYTESUR NEGATIVE 07/26/2020 1244   Sepsis Labs: @LABRCNTIP (procalcitonin:4,lacticidven:4) ) Recent Results (from the past 240 hour(s))  Respiratory  Panel by RT PCR (Flu A&B, Covid) - Nasopharyngeal Swab     Status: None   Collection Time: 07/26/20  4:45 PM   Specimen: Nasopharyngeal Swab  Result Value Ref Range Status   SARS Coronavirus 2 by RT PCR NEGATIVE NEGATIVE Final    Comment: (NOTE) SARS-CoV-2 target nucleic acids are NOT DETECTED.  The SARS-CoV-2 RNA is generally detectable in upper respiratoy specimens during the acute phase of infection. The lowest concentration of SARS-CoV-2 viral copies this assay can detect is 131 copies/mL. A negative result does not preclude SARS-Cov-2 infection and should not be used as the sole basis for treatment or other patient management decisions. A negative result may occur with  improper specimen collection/handling, submission of specimen other than nasopharyngeal swab, presence of viral mutation(s) within the areas targeted by this assay, and inadequate number of viral copies (<131 copies/mL). A negative result must be combined with clinical observations, patient history, and epidemiological information. The expected result is Negative.  Fact Sheet for Patients:  PinkCheek.be  Fact Sheet for Healthcare Providers:  GravelBags.it  This test is no t yet approved or cleared by  the [place] FDA and  has been authorized for detection and/or diagnosis of SARS-CoV-2 by FDA under an Emergency Use Authorization (EUA). This EUA will remain  in effect (meaning this test can be used) for the duration of the COVID-19 declaration under Section 564(b)(1) of the Act, 21 U.S.C. section 360bbb-3(b)(1), unless the authorization is terminated or revoked sooner.     Influenza A by PCR NEGATIVE NEGATIVE Final   Influenza B by PCR NEGATIVE NEGATIVE Final    Comment: (NOTE) The Xpert Xpress SARS-CoV-2/FLU/RSV assay is intended as an aid in  the diagnosis of influenza from Nasopharyngeal swab specimens and  should not be used as a sole basis for treatment. Nasal washings and  aspirates are unacceptable for Xpert Xpress SARS-CoV-2/FLU/RSV  testing.  Fact Sheet for Patients: PinkCheek.be  Fact Sheet for Healthcare Providers: GravelBags.it  This test is not yet approved or cleared by the [place] FDA and  has been authorized for detection and/or diagnosis of SARS-CoV-2 by  FDA under an Emergency Use Authorization (EUA). This EUA will remain  in effect (meaning this test can be used) for the duration of the  Covid-19 declaration under Section 564(b)(1) of the Act, 21  U.S.C. section 360bbb-3(b)(1), unless the authorization is  terminated or revoked. Performed at [facility], [address] [place], [place] [address]      Radiological Exams on Admission: CT Abdomen Pelvis W Contrast  Result Date: 07/26/2020 CLINICAL DATA:  Right lower quadrant abdominal pain EXAM: CT ABDOMEN AND PELVIS WITH CONTRAST TECHNIQUE: Multidetector CT imaging of the abdomen and pelvis was performed using the standard protocol following bolus administration of intravenous contrast. CONTRAST:  141mL OMNIPAQUE IOHEXOL 300 MG/ML  SOLN COMPARISON:  None. FINDINGS: Lower chest: Visualized lung bases are clear. Visualized heart  and pericardium are unremarkable peer Hepatobiliary: Mild hepatomegaly. No focal liver lesion. No intra or extrahepatic biliary ductal dilation. Gallbladder unremarkable. Pancreas: Unremarkable. Spleen: Moderate splenomegaly with the spleen measuring 17 cm in greatest dimension. No intrasplenic lesion identified. The splenic vein is patent. Adrenals/Urinary Tract: The adrenal glands are unremarkable. There is a heterogeneously enhancing collection seen within the posterior right pararenal space intimately associated with the posterior, inferior pole of the left kidney which demonstrates hypoenhancement on delayed images. This measures 9.2 x 5.0 cm on axial image # 38. There is no extravasation  of contrast noted on delayed images. Together, the findings may reflect focal pyelonephritis with retroperitoneal abscess formation, retroperitoneal hemorrhage secondary to an underlying renal mass, or a retroperitoneal urinoma, though the latter is considered less likely given the lack of contrast extravasation. There is extension of this inflammatory or hemorrhagic process into the retroperitoneal space lateral to the right kidney and subjacent to the transversus abdominus musculature. Hypoenhancement of the lower pole of the right kidney is noted on delayed images. The kidneys are otherwise normal in size and position and demonstrate normal cortical enhancement. Simple cortical cyst within the left kidney. No hydronephrosis. No intrarenal or ureteral calculi. Bladder unremarkable. Stomach/Bowel: The stomach, small bowel, and large bowel are unremarkable. Appendix normal. No free intraperitoneal gas or fluid. Vascular/Lymphatic: Shotty pericaval adenopathy is present, likely reactive. No frankly pathologic adenopathy within the abdomen and pelvis. The abdominal vasculature is unremarkable. Reproductive: Prostate is unremarkable. Other: Rectum unremarkable. Musculoskeletal: No acute bone abnormality. IMPRESSION: Irregularly  enhancing collection within the right posterior pararenal space intimately associated with the posteroinferior pole of the right kidney extending into the right psoas and subsequently into the retroperitoneal space subjacent to the right transverse abdominus musculature. Differential considerations include hemorrhage related to an underlying renal mass, a retroperitoneal abscess related to focal pyonephrosis, or a chronic urinoma though no definite leakage of urine is seen at this time on delayed images. There is associated hypoenhancement of a the adjacent renal cortex again favoring a renal etiology. Among the differential considerations, retroperitoneal abscess is considered most likely. Exclusion of an underlying mass could be performed with dedicated renal mass protocol CT or MRI imaging. Electronically Signed   By: Fidela Salisbury MD   On: 07/26/2020 16:01      Assessment/Plan Principal Problem:   Abnormal CT of the abdomen Active Problems:   Flank pain    1. Possible retroperitoneal abscess involving the right para renal space for which urologist Dr. Lovena Neighbours was consulted by the ER physician and had requested interventional radiology consult for drain placement.  Patient will be kept n.p.o. antibiotics follow cultures continue IV fluids pain relief medications. 2. Hyperglycemia could be from recent use of steroids for back pain.  However since hyperglycemia significant we will check hemoglobin A1c to confirm if patient is nondiabetic.  On sliding scale coverage for now. 3. We will have to rule out renal mass eventually with further imaging. 4. Anemia appears to be new.  Normocytic normochromic follow CBC type and screen follow anemia panel. 5. Low back pain could be from the #1.  Was recently placed on steroids for sciatica.  Since patient is having possible abscess requiring possible procedure will need close monitoring for any further worsening in inpatient status.   DVT prophylaxis:  SCDs.  Avoiding anticoagulation in anticipation of procedure. Code Status: Full code. Family Communication: Discussed with patient. Disposition Plan: Home. Consults called: ER physician discussed with urologist. Admission status: Inpatient.   Rise Patience MD [facility] Pager [phone].  If 7PM-7AM, please contact night-coverage [email or web address] Password [facility]  07/26/2020, 11:29 PM

## 2020-07-26 NOTE — ED Triage Notes (Signed)
Pt c/o RLQ pain started yesterday-sent from [facility]

## 2020-07-27 ENCOUNTER — Inpatient Hospital Stay (HOSPITAL_COMMUNITY): Payer: 59

## 2020-07-27 DIAGNOSIS — R935 Abnormal findings on diagnostic imaging of other abdominal regions, including retroperitoneum: Secondary | ICD-10-CM | POA: Diagnosis not present

## 2020-07-27 LAB — CBC WITH DIFFERENTIAL/PLATELET
Abs Immature Granulocytes: 0.5 10*3/uL — ABNORMAL HIGH (ref 0.00–0.07)
Basophils Absolute: 0.1 10*3/uL (ref 0.0–0.1)
Basophils Relative: 0 %
Eosinophils Absolute: 0 10*3/uL (ref 0.0–0.5)
Eosinophils Relative: 0 %
HCT: 34.1 % — ABNORMAL LOW (ref 39.0–52.0)
Hemoglobin: 10.9 g/dL — ABNORMAL LOW (ref 13.0–17.0)
Immature Granulocytes: 2 %
Lymphocytes Relative: 4 %
Lymphs Abs: 1.1 10*3/uL (ref 0.7–4.0)
MCH: 28.2 pg (ref 26.0–34.0)
MCHC: 32 g/dL (ref 30.0–36.0)
MCV: 88.1 fL (ref 80.0–100.0)
Monocytes Absolute: 0.7 10*3/uL (ref 0.1–1.0)
Monocytes Relative: 3 %
Neutro Abs: 22 10*3/uL — ABNORMAL HIGH (ref 1.7–7.7)
Neutrophils Relative %: 91 %
Platelets: 474 10*3/uL — ABNORMAL HIGH (ref 150–400)
RBC: 3.87 MIL/uL — ABNORMAL LOW (ref 4.22–5.81)
RDW: 13.9 % (ref 11.5–15.5)
WBC: 24.3 10*3/uL — ABNORMAL HIGH (ref 4.0–10.5)
nRBC: 0 % (ref 0.0–0.2)

## 2020-07-27 LAB — RETICULOCYTES
Immature Retic Fract: 24.7 % — ABNORMAL HIGH (ref 2.3–15.9)
RBC.: 3.88 MIL/uL — ABNORMAL LOW (ref 4.22–5.81)
Retic Count, Absolute: 72.9 10*3/uL (ref 19.0–186.0)
Retic Ct Pct: 1.9 % (ref 0.4–3.1)

## 2020-07-27 LAB — GLUCOSE, CAPILLARY
Glucose-Capillary: 284 mg/dL — ABNORMAL HIGH (ref 70–99)
Glucose-Capillary: 283 mg/dL — ABNORMAL HIGH (ref 70–99)
Glucose-Capillary: 265 mg/dL — ABNORMAL HIGH (ref 70–99)
Glucose-Capillary: 343 mg/dL — ABNORMAL HIGH (ref 70–99)
Glucose-Capillary: 347 mg/dL — ABNORMAL HIGH (ref 70–99)

## 2020-07-27 LAB — VITAMIN B12: Vitamin B-12: 524 pg/mL (ref 180–914)

## 2020-07-27 LAB — BASIC METABOLIC PANEL
Anion gap: 16 — ABNORMAL HIGH (ref 5–15)
BUN: 15 mg/dL (ref 8–23)
CO2: 18 mmol/L — ABNORMAL LOW (ref 22–32)
Calcium: 8.3 mg/dL — ABNORMAL LOW (ref 8.9–10.3)
Chloride: 101 mmol/L (ref 98–111)
Creatinine, Ser: 0.79 mg/dL (ref 0.61–1.24)
GFR, Estimated: >60 mL/min (ref >60)
Glucose, Bld: 307 mg/dL — ABNORMAL HIGH (ref 70–99)
Potassium: 3.7 mmol/L (ref 3.5–5.1)
Sodium: 135 mmol/L (ref 135–145)

## 2020-07-27 LAB — HEPATIC FUNCTION PANEL
ALT: 11 U/L (ref 0–44)
AST: 9 U/L — ABNORMAL LOW (ref 15–41)
Albumin: 2.4 g/dL — ABNORMAL LOW (ref 3.5–5.0)
Alkaline Phosphatase: 127 U/L — ABNORMAL HIGH (ref 38–126)
Bilirubin, Direct: 0.1 mg/dL (ref 0.0–0.2)
Indirect Bilirubin: 1.3 mg/dL — ABNORMAL HIGH (ref 0.3–0.9)
Total Bilirubin: 1.4 mg/dL — ABNORMAL HIGH (ref 0.3–1.2)
Total Protein: 7 g/dL (ref 6.5–8.1)

## 2020-07-27 LAB — FERRITIN: Ferritin: 775 ng/mL — ABNORMAL HIGH (ref 24–336)

## 2020-07-27 LAB — IRON AND TIBC
Iron: 23 ug/dL — ABNORMAL LOW (ref 45–182)
Saturation Ratios: 13 % — ABNORMAL LOW (ref 17.9–39.5)
TIBC: 175 ug/dL — ABNORMAL LOW (ref 250–450)
UIBC: 152 ug/dL

## 2020-07-27 LAB — FOLATE: Folate: 15.7 ng/mL (ref >5.9)

## 2020-07-27 LAB — PROTIME-INR
INR: 1.3 — ABNORMAL HIGH (ref 0.8–1.2)
Prothrombin Time: 15.8 seconds — ABNORMAL HIGH (ref 11.4–15.2)

## 2020-07-27 LAB — HIV ANTIBODY (ROUTINE TESTING W REFLEX): HIV Screen 4th Generation wRfx: NONREACTIVE

## 2020-07-27 MED ORDER — SODIUM CHLORIDE 0.9 % IV SOLN
INTRAVENOUS | Status: AC | PRN
  Administered 2020-07-27: 10 mL/h via INTRAVENOUS

## 2020-07-27 MED ORDER — NALOXONE HCL 0.4 MG/ML IJ SOLN
INTRAMUSCULAR | Status: AC
  Filled 2020-07-27: qty 1

## 2020-07-27 MED ORDER — HYDROMORPHONE HCL 1 MG/ML IJ SOLN
0.5000 mg | INTRAMUSCULAR | Status: DC | PRN
  Administered 2020-07-27 – 2020-07-28 (×8): 0.5 mg via INTRAVENOUS
  Filled 2020-07-27 (×8): qty 0.5

## 2020-07-27 MED ORDER — INSULIN ASPART 100 UNIT/ML ~~LOC~~ SOLN
0.0000 [IU] | SUBCUTANEOUS | Status: DC
  Administered 2020-07-27: 9 [IU] via SUBCUTANEOUS
  Administered 2020-07-27: 7 [IU] via SUBCUTANEOUS
  Administered 2020-07-27 – 2020-07-28 (×4): 5 [IU] via SUBCUTANEOUS
  Administered 2020-07-28: 3 [IU] via SUBCUTANEOUS
  Administered 2020-07-28: 5 [IU] via SUBCUTANEOUS
  Administered 2020-07-28: 7 [IU] via SUBCUTANEOUS
  Administered 2020-07-28 – 2020-07-29 (×2): 5 [IU] via SUBCUTANEOUS
  Administered 2020-07-29: 3 [IU] via SUBCUTANEOUS
  Administered 2020-07-29: 5 [IU] via SUBCUTANEOUS

## 2020-07-27 MED ORDER — FLUMAZENIL 0.5 MG/5ML IV SOLN
INTRAVENOUS | Status: AC
  Filled 2020-07-27: qty 5

## 2020-07-27 MED ORDER — MIDAZOLAM HCL 2 MG/2ML IJ SOLN
INTRAMUSCULAR | Status: AC | PRN
  Administered 2020-07-27: 0.5 mg via INTRAVENOUS
  Administered 2020-07-27: 1 mg via INTRAVENOUS

## 2020-07-27 MED ORDER — MIDAZOLAM HCL 2 MG/2ML IJ SOLN
INTRAMUSCULAR | Status: AC
  Filled 2020-07-27: qty 2

## 2020-07-27 MED ORDER — SODIUM CHLORIDE 0.9 % IV SOLN
510.0000 mg | Freq: Once | INTRAVENOUS | Status: AC
  Administered 2020-07-27: 510 mg via INTRAVENOUS
  Filled 2020-07-27: qty 510

## 2020-07-27 MED ORDER — FENTANYL CITRATE (PF) 100 MCG/2ML IJ SOLN
INTRAMUSCULAR | Status: AC
  Filled 2020-07-27: qty 2

## 2020-07-27 MED ORDER — IOHEXOL 300 MG/ML  SOLN
100.0000 mL | Freq: Once | INTRAMUSCULAR | Status: AC | PRN
  Administered 2020-07-27: 100 mL via INTRAVENOUS

## 2020-07-27 MED ORDER — SODIUM CHLORIDE 0.9 % IV SOLN
INTRAVENOUS | Status: AC
  Filled 2020-07-27: qty 250

## 2020-07-27 MED ORDER — PIPERACILLIN-TAZOBACTAM 3.375 G IVPB
3.3750 g | Freq: Three times a day (TID) | INTRAVENOUS | Status: DC
  Administered 2020-07-27 – 2020-07-29 (×7): 3.375 g via INTRAVENOUS
  Filled 2020-07-27 (×7): qty 50

## 2020-07-27 MED ORDER — SODIUM CHLORIDE 0.9% FLUSH
5.0000 mL | Freq: Three times a day (TID) | INTRAVENOUS | Status: DC
  Administered 2020-07-27 – 2020-08-01 (×15): 5 mL

## 2020-07-27 MED ORDER — FENTANYL CITRATE (PF) 100 MCG/2ML IJ SOLN
INTRAMUSCULAR | Status: AC | PRN
Start: 2020-07-27 — End: 2020-07-27
  Administered 2020-07-27 (×3): 25 ug via INTRAVENOUS

## 2020-07-27 NOTE — Progress Notes (Signed)
Pharmacy Antibiotic Note  Paul Estes is a 64 y.o. male admitted on 07/26/2020 with abdominal pain.  Pharmacy has been consulted for zosyn dosing.  Plan: Zosyn 3.375gm IV q8h extended interval Pharmacy will sign off and follow peripherally as renal adjustment unlikely  Height: 6' (182.9 cm) Weight: 105.1 kg (231 lb 11.3 oz) IBW/kg (Calculated) : 77.6  Temp (24hrs), Avg:99.1 F (37.3 C), Min:98.4 F (36.9 C), Max:100.1 F (37.8 C)  Recent Labs  Lab 07/26/20 1243 07/26/20 1701  WBC 23.2*  --   CREATININE 0.71  --   LATICACIDVEN  --  1.0    Estimated Creatinine Clearance: 116.9 mL/min (by C-G formula based on SCr of 0.71 mg/dL).    No Known Allergies  Thank you for allowing pharmacy to be a part of this patients care.  Dolly Rias RPh 07/27/2020, 5:07 AM

## 2020-07-27 NOTE — Consult Note (Signed)
Chief Complaint: Patient was seen in consultation today for CT guided right renal/pararenal space fluid collection drainage  Chief Complaint  Patient presents with  . Abdominal Pain    Referring Physician(s): [place]  Supervising Physician: Arne Cleveland  Patient Status: [facility] - In-pt  History of Present Illness: Paul Estes is a 64 y.o. male with hx skin cancer, OA, and back pain with rad to RLE who presents now with persistent worsening of pain along with onset of RLQ /lat abd pain . CT A/P revealed :   Irregularly enhancing collection within the right posterior pararenal space intimately associated with the posteroinferior pole of the right kidney extending into the right psoas and subsequently into the retroperitoneal space subjacent to the right transverse abdominus musculature. Differential considerations include hemorrhage related to an underlying renal mass, a retroperitoneal abscess related to focal pyonephrosis, or a chronic urinoma though no definite leakage of urine is seen at this time on delayed images. There is associated hypoenhancement of a the adjacent renal cortex again favoring a renal etiology. Among the differential considerations, retroperitoneal abscess is considered most likely. Exclusion of an underlying mass could be performed with dedicated renal mass protocol CT or MRI imaging.  He is afebrile; WBC 24.3, hgb 10.9, plts 474k, creat 0.79, COVID 19 neg. , PT/INR 15.3/1.3, blood cx neg to date; urine cx pend; request now received for CT-guided drainage of right renal/perirenal fluid collection.  Past Surgical History:  Procedure Laterality Date  . BASAL CELL CARCINOMA EXCISION    . NASAL SEPTUM SURGERY    . RADIAL HEAD EXCISION    . SQUAMOUS CELL CARCINOMA EXCISION  2013   from right ear per Dr. Ronnald Ramp     Allergies: Patient has no known allergies.  Medications: Prior to Admission medications   Medication Sig Start Date End Date Taking?  Authorizing Provider  cetirizine (ZYRTEC) 10 MG tablet Take 10 mg by mouth daily.   Yes [provider]  cyclobenzaprine (FLEXERIL) 10 MG tablet Take 1 tablet (10 mg total) by mouth 2 (two) times daily as needed for muscle spasms. 07/23/20  Yes Sharion Balloon, NP  fluticasone (FLONASE) 50 MCG/ACT nasal spray Place into both nostrils daily.   Yes [provider]  ibuprofen (ADVIL) 800 MG tablet Take 1 tablet (800 mg total) by mouth every 8 (eight) hours as needed. 07/23/20  Yes Sharion Balloon, NP  predniSONE (STERAPRED UNI-PAK 21 TAB) 10 MG (21) TBPK tablet Take by mouth daily. As directed 07/23/20  Yes Sharion Balloon, NP  aspirin 325 MG tablet Take 325 mg by mouth daily. Patient not taking: Reported on 07/26/2020    [provider]  azithromycin (ZITHROMAX) 250 MG tablet Take 1 tablet (250 mg total) by mouth daily. Take first 2 tablets together, then 1 every day until finished. Patient not taking: Reported on 07/26/2020 07/13/20   Sharion Balloon, NP  diclofenac (VOLTAREN) 75 MG EC tablet Take 1 tablet (75 mg total) by mouth 2 (two) times daily. Patient not taking: Reported on 07/26/2020 03/29/17   Wallene Huh, DPM     Family History  Problem Relation Age of Onset  . Arthritis Neg Hx        family hx  . Diabetes Neg Hx        family hx  . Cancer Neg Hx        lung, prostate  . Parkinsonism Neg Hx        family hx  Social History   Socioeconomic History  . Marital status: Married    Spouse name: Not on file  . Number of children: Not on file  . Years of education: Not on file  . Highest education level: Not on file  Occupational History  . Not on file  Tobacco Use  . Smoking status: Never Smoker  . Smokeless tobacco: Never Used  Substance and Sexual Activity  . Alcohol use: No  . Drug use: No  . Sexual activity: Not on file  Other Topics Concern  . Not on file  Social History Narrative  . Not on file   Social Determinants of Health    Financial Resource Strain:   . Difficulty of Paying Living Expenses: Not on file  Food Insecurity:   . Worried About Charity fundraiser in the Last Year: Not on file  . Ran Out of Food in the Last Year: Not on file  Transportation Needs:   . Lack of Transportation (Medical): Not on file  . Lack of Transportation (Non-Medical): Not on file  Physical Activity:   . Days of Exercise per Week: Not on file  . Minutes of Exercise per Session: Not on file  Stress:   . Feeling of Stress : Not on file  Social Connections:   . Frequency of Communication with Friends and Family: Not on file  . Frequency of Social Gatherings with Friends and Family: Not on file  . Attends Religious Services: Not on file  . Active Member of Clubs or Organizations: Not on file  . Attends Archivist Meetings: Not on file  . Marital Status: Not on file      Review of Systems currently denies fever, headache, chest pain, dyspnea, nausea, vomiting or bleeding.  Does have occasional cough, and above-mentioned abdominal/back pain.  Vital Signs: BP (!) 162/82 (BP Location: Left Arm)   Pulse 95   Temp 98.9 F (37.2 C) (Oral)   Resp 16   Ht 6' (1.829 m)   Wt 231 lb 11.3 oz (105.1 kg)   SpO2 93%   BMI 31.42 kg/m   Physical Exam awake, alert.  Chest with slighly diminished breath sounds right base, left clear.  Heart with regular rate and rhythm.  Abdomen soft, positive bowel sounds, tender right lower quadrant/lateral abdominal /flank region; no lower extremity edema  Imaging: DG Chest 2 View  Result Date: 07/13/2020 CLINICAL DATA:  Productive cough. EXAM: CHEST - 2 VIEW COMPARISON:  None. FINDINGS: The heart size and mediastinal contours are within normal limits. Both lungs are clear. The visualized skeletal structures are unremarkable. IMPRESSION: No active cardiopulmonary disease. Electronically Signed   By: Lovey Newcomer M.D.   On: 07/13/2020 15:27   CT Abdomen Pelvis W Contrast  Result Date:  07/26/2020 CLINICAL DATA:  Right lower quadrant abdominal pain EXAM: CT ABDOMEN AND PELVIS WITH CONTRAST TECHNIQUE: Multidetector CT imaging of the abdomen and pelvis was performed using the standard protocol following bolus administration of intravenous contrast. CONTRAST:  14mL OMNIPAQUE IOHEXOL 300 MG/ML  SOLN COMPARISON:  None. FINDINGS: Lower chest: Visualized lung bases are clear. Visualized heart and pericardium are unremarkable peer Hepatobiliary: Mild hepatomegaly. No focal liver lesion. No intra or extrahepatic biliary ductal dilation. Gallbladder unremarkable. Pancreas: Unremarkable. Spleen: Moderate splenomegaly with the spleen measuring 17 cm in greatest dimension. No intrasplenic lesion identified. The splenic vein is patent. Adrenals/Urinary Tract: The adrenal glands are unremarkable. There is a heterogeneously enhancing collection seen within the posterior right pararenal space  intimately associated with the posterior, inferior pole of the left kidney which demonstrates hypoenhancement on delayed images. This measures 9.2 x 5.0 cm on axial image # 38. There is no extravasation of contrast noted on delayed images. Together, the findings may reflect focal pyelonephritis with retroperitoneal abscess formation, retroperitoneal hemorrhage secondary to an underlying renal mass, or a retroperitoneal urinoma, though the latter is considered less likely given the lack of contrast extravasation. There is extension of this inflammatory or hemorrhagic process into the retroperitoneal space lateral to the right kidney and subjacent to the transversus abdominus musculature. Hypoenhancement of the lower pole of the right kidney is noted on delayed images. The kidneys are otherwise normal in size and position and demonstrate normal cortical enhancement. Simple cortical cyst within the left kidney. No hydronephrosis. No intrarenal or ureteral calculi. Bladder unremarkable. Stomach/Bowel: The stomach, small bowel,  and large bowel are unremarkable. Appendix normal. No free intraperitoneal gas or fluid. Vascular/Lymphatic: Shotty pericaval adenopathy is present, likely reactive. No frankly pathologic adenopathy within the abdomen and pelvis. The abdominal vasculature is unremarkable. Reproductive: Prostate is unremarkable. Other: Rectum unremarkable. Musculoskeletal: No acute bone abnormality. IMPRESSION: Irregularly enhancing collection within the right posterior pararenal space intimately associated with the posteroinferior pole of the right kidney extending into the right psoas and subsequently into the retroperitoneal space subjacent to the right transverse abdominus musculature. Differential considerations include hemorrhage related to an underlying renal mass, a retroperitoneal abscess related to focal pyonephrosis, or a chronic urinoma though no definite leakage of urine is seen at this time on delayed images. There is associated hypoenhancement of a the adjacent renal cortex again favoring a renal etiology. Among the differential considerations, retroperitoneal abscess is considered most likely. Exclusion of an underlying mass could be performed with dedicated renal mass protocol CT or MRI imaging. Electronically Signed   By: Fidela Salisbury MD   On: 07/26/2020 16:01    Labs:  CBC: Recent Labs    07/26/20 1243 07/27/20 0612  WBC 23.2* 24.3*  HGB 10.6* 10.9*  HCT 32.7* 34.1*  PLT 442* 474*    COAGS: Recent Labs    07/26/20 1701 07/27/20 0612  INR 1.3* 1.3*  APTT 28  --     BMP: Recent Labs    07/26/20 1243 07/27/20 0612  NA 130* 135  K 3.8 3.7  CL 93* 101  CO2 22 18*  GLUCOSE 386* 307*  BUN 16 15  CALCIUM 8.4* 8.3*  CREATININE 0.71 0.79  GFRNONAA >60 >60    LIVER FUNCTION TESTS: Recent Labs    07/26/20 1243 07/27/20 0612  BILITOT 0.7 1.4*  AST 12* 9*  ALT 13 11  ALKPHOS 122 127*  PROT 7.2 7.0  ALBUMIN 2.3* 2.4*    TUMOR MARKERS: No results for input(s): AFPTM, CEA,  CA199, CHROMGRNA in the last 8760 hours.  Assessment and Plan:  64 y.o. male with hx skin cancer, OA, and back pain with rad to RLE who presents now with persistent worsening of pain along with onset of RLQ /lat abd pain . CT A/P revealed :   Irregularly enhancing collection within the right posterior pararenal space intimately associated with the posteroinferior pole of the right kidney extending into the right psoas and subsequently into the retroperitoneal space subjacent to the right transverse abdominus musculature. Differential considerations include hemorrhage related to an underlying renal mass, a retroperitoneal abscess related to focal pyonephrosis, or a chronic urinoma though no definite leakage of urine is seen at this time on delayed  images. There is associated hypoenhancement of a the adjacent renal cortex again favoring a renal etiology. Among the differential considerations, retroperitoneal abscess is considered most likely. Exclusion of an underlying mass could be performed with dedicated renal mass protocol CT or MRI imaging.  He is afebrile; WBC 24.3, hgb 10.9, plts 474k, creat 0.79, COVID 19 neg. , PT/INR 15.3/1.3, blood cx neg to date; urine cx pend; request now received for CT-guided drainage of right renal/perirenal fluid collection.  Imaging studies have been reviewed by Dr. Vernard Gambles.  Details/risks of procedure, including but not limited to, internal bleeding, infection, injury to adjacent structures discussed with patient with his understanding and consent.  Procedure scheduled for today.   Thank you for this interesting consult.  I greatly enjoyed meeting Paul Estes and look forward to participating in their care.  A copy of this report was sent to the requesting provider on this date.  Electronically Signed: D. Rowe Robert, PA-C 07/27/2020, 9:33 AM   I spent a total of 30 minutes    in face to face in clinical consultation, greater than 50% of which was  counseling/coordinating care for CT-guided drainage of right renal/perirenal fluid collection

## 2020-07-27 NOTE — Consult Note (Addendum)
Urology Consult   Physician requesting consult: Gean Birchwood, MD  Reason for consult: Right retroperitoneal abscess  History of Present Illness: Paul Estes is a 64 y.o. Estes who presented to the [place] emergency department with a 1 week history of worsening right-sided flank pain associated with subjective fevers of 101 F and leukocytosis.  He describes the pain as sharp, constant and radiates to the right hip.  He had a CT abdomen/pelvis on 07/26/2020 that revealed a large right rib retroperitoneal/perinephric fluid collection concerning for abscess.  He notes that he had a prednisone injection approximately 1 week ago for ongoing right-sided hip pain along with a Covid infection in September of this year.  The patient denies a history of voiding or storage urinary symptoms, hematuria, UTIs, STDs, IV drug use, immunodeficiency, HIV, urolithiasis, GU malignancy/trauma/surgery.  Past Medical History:  Diagnosis Date  . ALLERGIC RHINITIS 09/08/2010   Qualifier: Diagnosis of  By: Joyce Gross    . Cancer [facility])    skin, sees Dr. Wilhemina Bonito   . OSTEOARTHRITIS 09/08/2010   Qualifier: Diagnosis of  By: Joyce Gross    . Sciatica     Past Surgical History:  Procedure Laterality Date  . BASAL CELL CARCINOMA EXCISION    . NASAL SEPTUM SURGERY    . RADIAL HEAD EXCISION    . SQUAMOUS CELL CARCINOMA EXCISION  2013   from right ear per Dr. Ronnald Ramp     Current [facility] Medications:  Home Meds:  Current Facility-Administered Medications for the 07/26/20 encounter [facility] Encounter)  Medication  . triamcinolone acetonide (KENALOG) 10 MG/ML injection 10 mg   Current Meds  Medication Sig  . cetirizine (ZYRTEC) 10 MG tablet Take 10 mg by mouth daily.  . cyclobenzaprine (FLEXERIL) 10 MG tablet Take 1 tablet (10 mg total) by mouth 2 (two) times daily as needed for muscle spasms.  . fluticasone (FLONASE) 50 MCG/ACT nasal spray Place into both nostrils daily.  Marland Kitchen ibuprofen  (ADVIL) 800 MG tablet Take 1 tablet (800 mg total) by mouth every 8 (eight) hours as needed.  . predniSONE (STERAPRED UNI-PAK 21 TAB) 10 MG (21) TBPK tablet Take by mouth daily. As directed    Scheduled Meds: . insulin aspart  0-9 Units Subcutaneous Q4H   Continuous Infusions: . sodium chloride 125 mL/hr at 10/Paul/21 0530  . piperacillin-tazobactam (ZOSYN)  IV 3.375 g (10/Paul/21 0532)   PRN Meds:.acetaminophen **OR** acetaminophen, HYDROmorphone (DILAUDID) injection  Allergies: No Known Allergies  Family History  Problem Relation Age of Onset  . Arthritis Neg Hx        family hx  . Diabetes Neg Hx        family hx  . Cancer Neg Hx        lung, prostate  . Parkinsonism Neg Hx        family hx    Social History:  reports that he has never smoked. He has never used smokeless tobacco. He reports that he does not drink alcohol and does not use drugs.  ROS: A complete review of systems was performed.  All systems are negative except for pertinent findings as noted.  Physical Exam:  Vital signs in last 24 hours: Temp:  [98.4 F (36.9 C)-100.1 F (37.8 C)] 98.9 F (37.2 C) (10/Paul 0615) Pulse Rate:  [88-97] 95 (10/Paul 0615) Resp:  [16-18] 16 (10/Paul 0615) BP: (128-165)/(57-87) 162/82 (10/Paul 0615) SpO2:  [90 %-97 %] 93 % (10/Paul 0615) Weight:  [105.1 kg-108.9 kg] 105.1 kg (10/Paul  0229) Constitutional:  Alert and oriented, No acute distress Cardiovascular: Regular rate and rhythm, No JVD Respiratory: Normal respiratory effort, Lungs clear bilaterally GI: Abdomen is soft, nontender, nondistended, no abdominal masses GU: No CVA tenderness Lymphatic: No lymphadenopathy Neurologic: Grossly intact, no focal deficits Psychiatric: Normal mood and affect  Laboratory Data:  Recent Labs    07/26/20 1243 10/Paul/21 0612  WBC Paul.2* 24.3*  HGB 10.6* 10.9*  HCT 32.7* 34.1*  PLT 442* 474*    Recent Labs    07/26/20 1243 10/Paul/21 0612  NA 130* 135  K 3.8 3.7  CL 93* 101  GLUCOSE  386* 307*  BUN 16 15  CALCIUM 8.4* 8.3*  CREATININE 0.71 0.79     Results for orders placed or performed during the hospital encounter of 07/26/20 (from the past 24 hour(s))  CBC with Differential     Status: Abnormal   Collection Time: 07/26/20 12:43 PM  Result Value Ref Range   WBC Paul.2 (H) 4.0 - 10.5 K/uL   RBC 3.87 (L) 4.22 - 5.81 MIL/uL   Hemoglobin 10.6 (L) 13.0 - 17.0 g/dL   HCT 32.7 (L) 39 - 52 %   MCV 84.5 80.0 - 100.0 fL   MCH 27.4 26.0 - 34.0 pg   MCHC 32.4 30.0 - 36.0 g/dL   RDW 13.5 11.5 - 15.5 %   Platelets 442 (H) 150 - 400 K/uL   nRBC 0.0 0.0 - 0.2 %   Neutrophils Relative % 90 %   Neutro Abs 20.9 (H) 1.7 - 7.7 K/uL   Lymphocytes Relative 6 %   Lymphs Abs 1.3 0.7 - 4.0 K/uL   Monocytes Relative 3 %   Monocytes Absolute 0.6 0.1 - 1.0 K/uL   Eosinophils Relative 0 %   Eosinophils Absolute 0.0 0.0 - 0.5 K/uL   Basophils Relative 0 %   Basophils Absolute 0.1 0.0 - 0.1 K/uL   Immature Granulocytes 1 %   Abs Immature Granulocytes 0.33 (H) 0.00 - 0.07 K/uL  Comprehensive metabolic panel     Status: Abnormal   Collection Time: 07/26/20 12:43 PM  Result Value Ref Range   Sodium 130 (L) 135 - 145 mmol/L   Potassium 3.8 3.5 - 5.1 mmol/L   Chloride 93 (L) 98 - 111 mmol/L   CO2 22 22 - 32 mmol/L   Glucose, Bld 386 (H) 70 - 99 mg/dL   BUN 16 8 - Paul mg/dL   Creatinine, Ser 0.71 0.61 - 1.24 mg/dL   Calcium 8.4 (L) 8.9 - 10.3 mg/dL   Total Protein 7.2 6.5 - 8.1 g/dL   Albumin 2.3 (L) 3.5 - 5.0 g/dL   AST 12 (L) 15 - 41 U/L   ALT 13 0 - 44 U/L   Alkaline Phosphatase 122 38 - 126 U/L   Total Bilirubin 0.7 0.3 - 1.2 mg/dL   GFR, Estimated >60 >60 mL/min   Anion gap 15 5 - 15  Lipase, blood     Status: None   Collection Time: 07/26/20 12:43 PM  Result Value Ref Range   Lipase 18 11 - 51 U/L  Urinalysis, Routine w reflex microscopic Urine, Clean Catch     Status: Abnormal   Collection Time: 07/26/20 12:44 PM  Result Value Ref Range   Color, Urine YELLOW YELLOW    APPearance CLEAR CLEAR   Specific Gravity, Urine 1.015 1.005 - 1.030   pH 6.0 5.0 - 8.0   Glucose, UA >=500 (A) NEGATIVE mg/dL   Hgb urine dipstick SMALL (  A) NEGATIVE   Bilirubin Urine NEGATIVE NEGATIVE   Ketones, ur >80 (A) NEGATIVE mg/dL   Protein, ur 30 (A) NEGATIVE mg/dL   Nitrite NEGATIVE NEGATIVE   Leukocytes,Ua NEGATIVE NEGATIVE  Urinalysis, Microscopic (reflex)     Status: Abnormal   Collection Time: 07/26/20 12:44 PM  Result Value Ref Range   RBC / HPF 0-5 0 - 5 RBC/hpf   WBC, UA 21-50 0 - 5 WBC/hpf   Bacteria, UA RARE (A) NONE SEEN   Squamous Epithelial / LPF 0-5 0 - 5   Granular Casts, UA PRESENT   Respiratory Panel by RT PCR (Flu A&B, Covid) - Nasopharyngeal Swab     Status: None   Collection Time: 07/26/20  4:45 PM   Specimen: Nasopharyngeal Swab  Result Value Ref Range   SARS Coronavirus 2 by RT PCR NEGATIVE NEGATIVE   Influenza A by PCR NEGATIVE NEGATIVE   Influenza B by PCR NEGATIVE NEGATIVE  Lactic acid, plasma     Status: None   Collection Time: 07/26/20  5:01 PM  Result Value Ref Range   Lactic Acid, Venous 1.0 0.5 - 1.9 mmol/L  Protime-INR     Status: Abnormal   Collection Time: 07/26/20  5:01 PM  Result Value Ref Range   Prothrombin Time 15.3 (H) 11.4 - 15.2 seconds   INR 1.3 (H) 0.8 - 1.2  APTT     Status: None   Collection Time: 07/26/20  5:01 PM  Result Value Ref Range   aPTT 28 24 - 36 seconds  Culture, blood (routine x 2)     Status: None (Preliminary result)   Collection Time: 07/26/20  5:55 PM   Specimen: BLOOD RIGHT HAND  Result Value Ref Range   Specimen Description      BLOOD RIGHT HAND Performed at [facility], [address] [place], [place] [address]    Special Requests      BOTTLES DRAWN AEROBIC AND ANAEROBIC Blood Culture adequate volume Performed at [facility], [address] [place], [place] [address]    Culture      NO GROWTH < 12 HOURS Performed at [facility], [place] [address],  [place], [place] [address]    Report Status PENDING   Culture, blood (routine x 2)     Status: None (Preliminary result)   Collection Time: 07/26/20  6:10 PM   Specimen: BLOOD  Result Value Ref Range   Specimen Description      BLOOD LEFT ANTECUBITAL Performed at [facility], [place] [address] [place], [place] [address]    Special Requests      BOTTLES DRAWN AEROBIC AND ANAEROBIC Blood Culture adequate volume Performed at [facility], [address] [place], [place] [address]    Culture      NO GROWTH < 12 HOURS Performed at [facility] [address], [place], [place] [address]    Report Status PENDING   Glucose, capillary     Status: Abnormal   Collection Time: 07/26/20 10:40 PM  Result Value Ref Range   Glucose-Capillary 290 (H) 70 - 99 mg/dL  Hepatic function panel     Status: Abnormal   Collection Time: 10/Paul/21  6:12 AM  Result Value Ref Range   Total Protein 7.0 6.5 - 8.1 g/dL   Albumin 2.4 (L) 3.5 - 5.0 g/dL   AST 9 (L) 15 - 41 U/L   ALT 11 0 - 44  U/L   Alkaline Phosphatase 127 (H) 38 - 126 U/L   Total Bilirubin 1.4 (H) 0.3 - 1.2 mg/dL   Bilirubin, Direct 0.1 0.0 - 0.2 mg/dL   Indirect Bilirubin 1.3 (H) 0.3 - 0.9 mg/dL  CBC WITH DIFFERENTIAL     Status: Abnormal   Collection Time: 10/Paul/21  6:12 AM  Result Value Ref Range   WBC 24.3 (H) 4.0 - 10.5 K/uL   RBC 3.87 (L) 4.22 - 5.81 MIL/uL   Hemoglobin 10.9 (L) 13.0 - 17.0 g/dL   HCT 34.1 (L) 39 - 52 %   MCV 88.1 80.0 - 100.0 fL   MCH 28.2 26.0 - 34.0 pg   MCHC 32.0 30.0 - 36.0 g/dL   RDW 13.9 11.5 - 15.5 %   Platelets 474 (H) 150 - 400 K/uL   nRBC 0.0 0.0 - 0.2 %   Neutrophils Relative % 91 %   Neutro Abs 22.0 (H) 1.7 - 7.7 K/uL   Lymphocytes Relative 4 %   Lymphs Abs 1.1 0.7 - 4.0 K/uL   Monocytes Relative 3 %   Monocytes Absolute 0.7 0.1 - 1.0 K/uL   Eosinophils Relative 0 %   Eosinophils Absolute 0.0 0.0 - 0.5 K/uL   Basophils Relative 0 %   Basophils Absolute 0.1 0.0 - 0.1 K/uL    Immature Granulocytes 2 %   Abs Immature Granulocytes 0.50 (H) 0.00 - 0.07 K/uL  Basic metabolic panel     Status: Abnormal   Collection Time: 10/Paul/21  6:12 AM  Result Value Ref Range   Sodium 135 135 - 145 mmol/L   Potassium 3.7 3.5 - 5.1 mmol/L   Chloride 101 98 - 111 mmol/L   CO2 18 (L) 22 - 32 mmol/L   Glucose, Bld 307 (H) 70 - 99 mg/dL   BUN 15 8 - Paul mg/dL   Creatinine, Ser 0.79 0.61 - 1.24 mg/dL   Calcium 8.3 (L) 8.9 - 10.3 mg/dL   GFR, Estimated >60 >60 mL/min   Anion gap 16 (H) 5 - 15  Protime-INR     Status: Abnormal   Collection Time: 10/Paul/21  6:12 AM  Result Value Ref Range   Prothrombin Time 15.8 (H) 11.4 - 15.2 seconds   INR 1.3 (H) 0.8 - 1.2  Vitamin B12     Status: None   Collection Time: 10/Paul/21  6:12 AM  Result Value Ref Range   Vitamin B-12 524 180 - 914 pg/mL  Folate     Status: None   Collection Time: 10/Paul/21  6:12 AM  Result Value Ref Range   Folate 15.7 >5.9 ng/mL  Iron and TIBC     Status: Abnormal   Collection Time: 10/Paul/21  6:12 AM  Result Value Ref Range   Iron Paul (L) 45 - 182 ug/dL   TIBC 175 (L) 250 - 450 ug/dL   Saturation Ratios 13 (L) 17.9 - 39.5 %   UIBC 152 ug/dL  Ferritin     Status: Abnormal   Collection Time: 10/Paul/21  6:12 AM  Result Value Ref Range   Ferritin 775 (H) 24 - 336 ng/mL  Reticulocytes     Status: Abnormal   Collection Time: 10/Paul/21  6:12 AM  Result Value Ref Range   Retic Ct Pct 1.9 0.4 - 3.1 %   RBC. 3.88 (L) 4.22 - 5.81 MIL/uL   Retic Count, Absolute 72.9 19.0 - 186.0 K/uL   Immature Retic Fract 24.7 (H) 2.3 - 15.9 %  Glucose, capillary  Status: Abnormal   Collection Time: 10/Paul/21  6:13 AM  Result Value Ref Range   Glucose-Capillary 284 (H) 70 - 99 mg/dL  Glucose, capillary     Status: Abnormal   Collection Time: 10/Paul/21  8:03 AM  Result Value Ref Range   Glucose-Capillary 283 (H) 70 - 99 mg/dL   Comment 1 Notify RN    Comment 2 Document in Chart    Recent Results (from the past 240 hour(s))   Respiratory Panel by RT PCR (Flu A&B, Covid) - Nasopharyngeal Swab     Status: None   Collection Time: 07/26/20  4:45 PM   Specimen: Nasopharyngeal Swab  Result Value Ref Range Status   SARS Coronavirus 2 by RT PCR NEGATIVE NEGATIVE Final    Comment: (NOTE) SARS-CoV-2 target nucleic acids are NOT DETECTED.  The SARS-CoV-2 RNA is generally detectable in upper respiratoy specimens during the acute phase of infection. The lowest concentration of SARS-CoV-2 viral copies this assay can detect is 131 copies/mL. A negative result does not preclude SARS-Cov-2 infection and should not be used as the sole basis for treatment or other patient management decisions. A negative result may occur with  improper specimen collection/handling, submission of specimen other than nasopharyngeal swab, presence of viral mutation(s) within the areas targeted by this assay, and inadequate number of viral copies (<131 copies/mL). A negative result must be combined with clinical observations, patient history, and epidemiological information. The expected result is Negative.  Fact Sheet for Patients:  PinkCheek.be  Fact Sheet for Healthcare Providers:  GravelBags.it  This test is no t yet approved or cleared by the [place] FDA and  has been authorized for detection and/or diagnosis of SARS-CoV-2 by FDA under an Emergency Use Authorization (EUA). This EUA will remain  in effect (meaning this test can be used) for the duration of the COVID-19 declaration under Section 564(b)(1) of the Act, 21 U.S.C. section 360bbb-3(b)(1), unless the authorization is terminated or revoked sooner.     Influenza A by PCR NEGATIVE NEGATIVE Final   Influenza B by PCR NEGATIVE NEGATIVE Final    Comment: (NOTE) The Xpert Xpress SARS-CoV-2/FLU/RSV assay is intended as an aid in  the diagnosis of influenza from Nasopharyngeal swab specimens and  should not be used as  a sole basis for treatment. Nasal washings and  aspirates are unacceptable for Xpert Xpress SARS-CoV-2/FLU/RSV  testing.  Fact Sheet for Patients: PinkCheek.be  Fact Sheet for Healthcare Providers: GravelBags.it  This test is not yet approved or cleared by the [place] FDA and  has been authorized for detection and/or diagnosis of SARS-CoV-2 by  FDA under an Emergency Use Authorization (EUA). This EUA will remain  in effect (meaning this test can be used) for the duration of the  Covid-19 declaration under Section 564(b)(1) of the Act, 21  U.S.C. section 360bbb-3(b)(1), unless the authorization is  terminated or revoked. Performed at [facility], [address] [place], [place] [address]   Culture, blood (routine x 2)     Status: None (Preliminary result)   Collection Time: 07/26/20  5:55 PM   Specimen: BLOOD RIGHT HAND  Result Value Ref Range Status   Specimen Description   Final    BLOOD RIGHT HAND Performed at [facility], [address] [place], [place] [address]    Special Requests   Final    BOTTLES DRAWN AEROBIC AND ANAEROBIC Blood Culture adequate volume Performed at [facility], [address] [place], [place] [address]    Culture   Final    NO GROWTH < 12 HOURS Performed at [facility] [address], [place], [place] [address]    Report Status PENDING  Incomplete  Culture, blood (routine x 2)     Status: None (Preliminary result)   Collection Time: 07/26/20  6:10 PM   Specimen: BLOOD  Result Value Ref Range Status   Specimen Description   Final    BLOOD LEFT ANTECUBITAL Performed at [facility], [place] [address], [place], [place] [address]    Special Requests   Final    BOTTLES DRAWN AEROBIC AND ANAEROBIC Blood Culture adequate volume Performed at [facility], [address] [place], [place] [address]    Culture   Final    NO  GROWTH < 12 HOURS Performed at [facility] [address] [place],  [address]    Report Status PENDING  Incomplete    Renal Function: Recent Labs    07/26/20 1243 10/Paul/21 0612  CREATININE 0.71 0.79   Estimated Creatinine Clearance: 116.9 mL/min (by C-G formula based on SCr of 0.79 mg/dL).  Radiologic Imaging: CT Abdomen Pelvis W Contrast  Result Date: 07/26/2020 CLINICAL DATA:  Right lower quadrant abdominal pain EXAM: CT ABDOMEN AND PELVIS WITH CONTRAST TECHNIQUE: Multidetector CT imaging of the abdomen and pelvis was performed using the standard protocol following bolus administration of intravenous contrast. CONTRAST:  125mL OMNIPAQUE IOHEXOL 300 MG/ML  SOLN COMPARISON:  None. FINDINGS: Lower chest: Visualized lung bases are clear. Visualized heart and pericardium are unremarkable peer Hepatobiliary: Mild hepatomegaly. No focal liver lesion. No intra or extrahepatic biliary ductal dilation. Gallbladder unremarkable. Pancreas: Unremarkable. Spleen: Moderate splenomegaly with the spleen measuring 17 cm in greatest dimension. No intrasplenic lesion identified. The splenic vein is patent. Adrenals/Urinary Tract: The adrenal glands are unremarkable. There is a heterogeneously enhancing collection seen within the posterior right pararenal space intimately associated with the posterior, inferior pole of the left kidney which demonstrates hypoenhancement on delayed images. This measures 9.2 x 5.0 cm on axial image # 38. There is no extravasation of contrast noted on delayed images. Together, the findings may reflect focal pyelonephritis with retroperitoneal abscess formation, retroperitoneal hemorrhage secondary to an underlying renal mass, or a retroperitoneal urinoma, though the latter is considered less likely given the lack of contrast extravasation. There is extension of this inflammatory or hemorrhagic process into the retroperitoneal space lateral to the right kidney and  subjacent to the transversus abdominus musculature. Hypoenhancement of the lower pole of the right kidney is noted on delayed images. The kidneys are otherwise normal in size and position and demonstrate normal cortical enhancement. Simple cortical cyst within the left kidney. No hydronephrosis. No intrarenal or ureteral calculi. Bladder unremarkable. Stomach/Bowel: The stomach, small bowel, and large bowel are unremarkable. Appendix normal. No free intraperitoneal gas or fluid. Vascular/Lymphatic: Shotty pericaval adenopathy is present, likely reactive. No frankly pathologic adenopathy within the abdomen and pelvis. The abdominal vasculature is unremarkable. Reproductive: Prostate is unremarkable. Other: Rectum unremarkable. Musculoskeletal: No acute bone abnormality. IMPRESSION: Irregularly enhancing collection within the right posterior pararenal space intimately associated with the posteroinferior pole of the right kidney extending into the right psoas and subsequently into the retroperitoneal space subjacent to the right transverse abdominus musculature. Differential considerations include hemorrhage related to an underlying renal mass, a retroperitoneal abscess related to focal pyonephrosis, or a chronic urinoma though no definite leakage of urine is  seen at this time on delayed images. There is associated hypoenhancement of a the adjacent renal cortex again favoring a renal etiology. Among the differential considerations, retroperitoneal abscess is considered most likely. Exclusion of an underlying mass could be performed with dedicated renal mass protocol CT or MRI imaging. Electronically Signed   By: Fidela Salisbury MD   On: 07/26/2020 16:01    I independently reviewed the above imaging studies.  Impression/Recommendation 64 year old Estes with a large right retroperitoneal/perinephric fluid collection, concerning for abscess  -CT renal protocol pending to rule out underlying mass as the source of his  right retroperitoneal fluid collection.  Given his significant leukocytosis and fever, abscess is favored at this juncture.  Preliminary blood cultures are showing no growth.  Urine culture is still pending.  Currently on IV Zosyn. -Dr. Milta Deiters with IR has been consulted for right retroperitoneal drain placement -Will continue to monitor  Ellison Hughs, [facility] 10/Paul/2021, 9:29 AM

## 2020-07-27 NOTE — Procedures (Signed)
  Procedure: CT guided R retroperitonael abscess drain catheter 54f   EBL:   minimal Complications:  none immediate  See full dictation in [facility].  Dillard Cannon MD Main # [phone] Pager  [phone] Mobile [phone]

## 2020-07-27 NOTE — Progress Notes (Signed)
PROGRESS NOTE    Paul Estes  IDP:[id number] DOB: 05/27/1956 DOA: 07/26/2020 PCP: [facility]   Chief Complain: Abdominal pain  Brief Narrative:  Patient is a 64 year old male with no significant past medical history with who was recently placed on steroids for low back pain/sciatica who presented with complaints of abdominal pain on the right side.  He  also felt that there is some swelling on the right lower quadrant.  On presentation he was febrile, had leukocytosis, elevated blood glucose.  CT abdomen/pelvis was concerning for enhancing collection in the right pararenal space concerning for retroperitoneal abscess.  Urology/IR consulted.  Started on empiric antibiotics.  He underwent IR guided retroperitoneal abscess drain catheter today.  cultures sent.   Assessment & Plan:   Principal Problem:   Abnormal CT of the abdomen Active Problems:   Flank pain   Suspected retroperitoneal abscess involving the right pararenal space:There was also suspicion for  renal mass but given his significant leukocytosis, fever, abscess was  favored. Urology consulted and following.  IR also consulted he underwent retroperitoneal abscess drain catheter on 07/27/2020 and there was frank drainage of abscess of the drain.  We will follow-up cultures. continue IV antibiotics.  Blood cultures have been sent, will follow up  Hyperglycemia: Most likely secondary to recent steroid use for back pain.  Hemoglobin A1c of.  No history of diabetes, continue sliding-scale insulin  Iron deficiency normocytic anemia: Iron panel showed iron deficiency.  Will give 1 dose of IV iron  Low back pain: Was on steroids for sciatica.  Continue supportive care.              DVT prophylaxis:SCD Code Status: Full Family Communication: None at bed side Status is: Inpatient  Remains inpatient appropriate because:Inpatient level of care appropriate due to severity of illness   Dispo:  The patient is from: Home              Anticipated d/c is to: Home              Anticipated d/c date is:1-2 days              Patient currently is not medically stable to d/c.     Consultants: Urology,IR  Procedures:None  Antimicrobials:  Anti-infectives (From admission, onward)   Start     Dose/Rate Route Frequency Ordered Stop   07/27/20 0600  piperacillin-tazobactam (ZOSYN) IVPB 3.375 g        3.375 g 12.5 mL/hr over 240 Minutes Intravenous Every 8 hours 07/27/20 0506     07/26/20 1345  cefTRIAXone (ROCEPHIN) 2 g in sodium chloride 0.9 % 100 mL IVPB       "And" Linked Group Details   2 g 200 mL/hr over 30 Minutes Intravenous  Once 07/26/20 1336 07/26/20 1444   07/26/20 1345  metroNIDAZOLE (FLAGYL) IVPB 500 mg       "And" Linked Group Details   500 mg 100 mL/hr over 60 Minutes Intravenous  Once 07/26/20 1336 07/26/20 1653      Subjective: Patient seen and examined at the bedside this morning after the IR procedure.  He feels relieved, significant improvement in the pain.  Hemodynamically stable.  Objective: Vitals:   07/26/20 2100 07/26/20 2246 07/27/20 0229 07/27/20 0615  BP: 128/68 (!) 163/86 (!) 158/81 (!) 162/82  Pulse: 94 96 97 95  Resp: 18 18 16 16   Temp:  100.1 F (37.8 C) 98.6 F (37 C) 98.9 F (37.2 C)  TempSrc:  Oral Oral Oral  SpO2: 95% 91% 93% 93%  Weight:   105.1 kg   Height:        Intake/Output Summary (Last 24 hours) at 07/27/2020 7829 Last data filed at 07/27/2020 0534 Gross per 24 hour  Intake 1238.96 ml  Output 1200 ml  Net 38.96 ml   Filed Weights   07/26/20 1204 07/27/20 0229  Weight: 106.1 kg 105.1 kg    Examination:  General exam: Appears calm and comfortable ,Not in distress, obese HEENT:PERRL,Oral mucosa moist, Ear/Nose normal on gross exam Respiratory system: Bilateral equal air entry, normal vesicular breath sounds, no wheezes or crackles  Cardiovascular system: S1 & S2 heard, RRR. No JVD, murmurs, rubs, gallops or clicks.  No pedal edema. Gastrointestinal system: Abdomen is nondistended, soft and nontender. No organomegaly or masses felt. Normal bowel sounds heard. Right costovertebral angle drain with output of pinkish abscess Central nervous system: Alert and oriented. No focal neurological deficits. Extremities: No edema, no clubbing ,no cyanosis Skin: No rashes, lesions or ulcers,no icterus ,no pallor    Data Reviewed: I have personally reviewed following labs and imaging studies  CBC: Recent Labs  Lab 07/26/20 1243 07/27/20 0612  WBC 23.2* 24.3*  NEUTROABS 20.9* 22.0*  HGB 10.6* 10.9*  HCT 32.7* 34.1*  MCV 84.5 88.1  PLT 442* 562*   Basic Metabolic Panel: Recent Labs  Lab 07/26/20 1243 07/27/20 0612  NA 130* 135  K 3.8 3.7  CL 93* 101  CO2 22 18*  GLUCOSE 386* 307*  BUN 16 15  CREATININE 0.71 0.79  CALCIUM 8.4* 8.3*   GFR: Estimated Creatinine Clearance: 116.9 mL/min (by C-G formula based on SCr of 0.79 mg/dL). Liver Function Tests: Recent Labs  Lab 07/26/20 1243 07/27/20 0612  AST 12* 9*  ALT 13 11  ALKPHOS 122 127*  BILITOT 0.7 1.4*  PROT 7.2 7.0  ALBUMIN 2.3* 2.4*   Recent Labs  Lab 07/26/20 1243  LIPASE 18   No results for input(s): AMMONIA in the last 168 hours. Coagulation Profile: Recent Labs  Lab 07/26/20 1701 07/27/20 0612  INR 1.3* 1.3*   Cardiac Enzymes: No results for input(s): CKTOTAL, CKMB, CKMBINDEX, TROPONINI in the last 168 hours. BNP (last 3 results) No results for input(s): PROBNP in the last 8760 hours. HbA1C: No results for input(s): HGBA1C in the last 72 hours. CBG: Recent Labs  Lab 07/26/20 2240 07/27/20 0613 07/27/20 0803  GLUCAP 290* 284* 283*   Lipid Profile: No results for input(s): CHOL, HDL, LDLCALC, TRIG, CHOLHDL, LDLDIRECT in the last 72 hours. Thyroid Function Tests: No results for input(s): TSH, T4TOTAL, FREET4, T3FREE, THYROIDAB in the last 72 hours. Anemia Panel: Recent Labs    07/27/20 0612  VITAMINB12 524   FOLATE 15.7  FERRITIN 775*  TIBC 175*  IRON 23*  RETICCTPCT 1.9   Sepsis Labs: Recent Labs  Lab 07/26/20 1701  LATICACIDVEN 1.0    Recent Results (from the past 240 hour(s))  Respiratory Panel by RT PCR (Flu A&B, Covid) - Nasopharyngeal Swab     Status: None   Collection Time: 07/26/20  4:45 PM   Specimen: Nasopharyngeal Swab  Result Value Ref Range Status   SARS Coronavirus 2 by RT PCR NEGATIVE NEGATIVE Final    Comment: (NOTE) SARS-CoV-2 target nucleic acids are NOT DETECTED.  The SARS-CoV-2 RNA is generally detectable in upper respiratoy specimens during the acute phase of infection. The lowest concentration of SARS-CoV-2 viral copies this assay can detect is 131 copies/mL. A negative result  does not preclude SARS-Cov-2 infection and should not be used as the sole basis for treatment or other patient management decisions. A negative result may occur with  improper specimen collection/handling, submission of specimen other than nasopharyngeal swab, presence of viral mutation(s) within the areas targeted by this assay, and inadequate number of viral copies (<131 copies/mL). A negative result must be combined with clinical observations, patient history, and epidemiological information. The expected result is Negative.  Fact Sheet for Patients:  PinkCheek.be  Fact Sheet for Healthcare Providers:  GravelBags.it  This test is no t yet approved or cleared by the [place] FDA and  has been authorized for detection and/or diagnosis of SARS-CoV-2 by FDA under an Emergency Use Authorization (EUA). This EUA will remain  in effect (meaning this test can be used) for the duration of the COVID-19 declaration under Section 564(b)(1) of the Act, 21 U.S.C. section 360bbb-3(b)(1), unless the authorization is terminated or revoked sooner.     Influenza A by PCR NEGATIVE NEGATIVE Final   Influenza B by PCR NEGATIVE  NEGATIVE Final    Comment: (NOTE) The Xpert Xpress SARS-CoV-2/FLU/RSV assay is intended as an aid in  the diagnosis of influenza from Nasopharyngeal swab specimens and  should not be used as a sole basis for treatment. Nasal washings and  aspirates are unacceptable for Xpert Xpress SARS-CoV-2/FLU/RSV  testing.  Fact Sheet for Patients: PinkCheek.be  Fact Sheet for Healthcare Providers: GravelBags.it  This test is not yet approved or cleared by the [place] FDA and  has been authorized for detection and/or diagnosis of SARS-CoV-2 by  FDA under an Emergency Use Authorization (EUA). This EUA will remain  in effect (meaning this test can be used) for the duration of the  Covid-19 declaration under Section 564(b)(1) of the Act, 21  U.S.C. section 360bbb-3(b)(1), unless the authorization is  terminated or revoked. Performed at [facility], [address] [place], [place] [address]   Culture, blood (routine x 2)     Status: None (Preliminary result)   Collection Time: 07/26/20  6:10 PM   Specimen: BLOOD  Result Value Ref Range Status   Specimen Description   Final    BLOOD LEFT ANTECUBITAL Performed at [facility], [place] [address], [place], [place] [address]    Special Requests   Final    BOTTLES DRAWN AEROBIC AND ANAEROBIC Blood Culture adequate volume Performed at [facility], [address] [place], [place] [address]    Culture PENDING  Incomplete   Report Status PENDING  Incomplete         Radiology Studies: CT Abdomen Pelvis W Contrast  Result Date: 07/26/2020 CLINICAL DATA:  Right lower quadrant abdominal pain EXAM: CT ABDOMEN AND PELVIS WITH CONTRAST TECHNIQUE: Multidetector CT imaging of the abdomen and pelvis was performed using the standard protocol following bolus administration of intravenous contrast. CONTRAST:  151mL OMNIPAQUE IOHEXOL 300 MG/ML  SOLN COMPARISON:  None.  FINDINGS: Lower chest: Visualized lung bases are clear. Visualized heart and pericardium are unremarkable peer Hepatobiliary: Mild hepatomegaly. No focal liver lesion. No intra or extrahepatic biliary ductal dilation. Gallbladder unremarkable. Pancreas: Unremarkable. Spleen: Moderate splenomegaly with the spleen measuring 17 cm in greatest dimension. No intrasplenic lesion identified. The splenic vein is patent. Adrenals/Urinary Tract: The adrenal glands are unremarkable. There is a heterogeneously enhancing collection seen within the posterior right pararenal space intimately associated with the posterior, inferior pole of the left kidney which demonstrates hypoenhancement on delayed  images. This measures 9.2 x 5.0 cm on axial image # 38. There is no extravasation of contrast noted on delayed images. Together, the findings may reflect focal pyelonephritis with retroperitoneal abscess formation, retroperitoneal hemorrhage secondary to an underlying renal mass, or a retroperitoneal urinoma, though the latter is considered less likely given the lack of contrast extravasation. There is extension of this inflammatory or hemorrhagic process into the retroperitoneal space lateral to the right kidney and subjacent to the transversus abdominus musculature. Hypoenhancement of the lower pole of the right kidney is noted on delayed images. The kidneys are otherwise normal in size and position and demonstrate normal cortical enhancement. Simple cortical cyst within the left kidney. No hydronephrosis. No intrarenal or ureteral calculi. Bladder unremarkable. Stomach/Bowel: The stomach, small bowel, and large bowel are unremarkable. Appendix normal. No free intraperitoneal gas or fluid. Vascular/Lymphatic: Shotty pericaval adenopathy is present, likely reactive. No frankly pathologic adenopathy within the abdomen and pelvis. The abdominal vasculature is unremarkable. Reproductive: Prostate is unremarkable. Other: Rectum  unremarkable. Musculoskeletal: No acute bone abnormality. IMPRESSION: Irregularly enhancing collection within the right posterior pararenal space intimately associated with the posteroinferior pole of the right kidney extending into the right psoas and subsequently into the retroperitoneal space subjacent to the right transverse abdominus musculature. Differential considerations include hemorrhage related to an underlying renal mass, a retroperitoneal abscess related to focal pyonephrosis, or a chronic urinoma though no definite leakage of urine is seen at this time on delayed images. There is associated hypoenhancement of a the adjacent renal cortex again favoring a renal etiology. Among the differential considerations, retroperitoneal abscess is considered most likely. Exclusion of an underlying mass could be performed with dedicated renal mass protocol CT or MRI imaging. Electronically Signed   By: Fidela Salisbury MD   On: 07/26/2020 16:01        Scheduled Meds: . insulin aspart  0-9 Units Subcutaneous Q4H   Continuous Infusions: . sodium chloride 125 mL/hr at 07/27/20 0530  . piperacillin-tazobactam (ZOSYN)  IV 3.375 g (07/27/20 0532)     LOS: 1 day    Time spent: 35 mins.More than 50% of that time was spent in counseling and/or coordination of care.      Shelly Coss, MD [facility] P10/23/2021, 8:33 AM

## 2020-07-28 ENCOUNTER — Inpatient Hospital Stay (HOSPITAL_COMMUNITY): Payer: 59

## 2020-07-28 DIAGNOSIS — R935 Abnormal findings on diagnostic imaging of other abdominal regions, including retroperitoneum: Secondary | ICD-10-CM | POA: Diagnosis not present

## 2020-07-28 LAB — GLUCOSE, CAPILLARY
Glucose-Capillary: 223 mg/dL — ABNORMAL HIGH (ref 70–99)
Glucose-Capillary: 252 mg/dL — ABNORMAL HIGH (ref 70–99)
Glucose-Capillary: 257 mg/dL — ABNORMAL HIGH (ref 70–99)
Glucose-Capillary: 264 mg/dL — ABNORMAL HIGH (ref 70–99)
Glucose-Capillary: 288 mg/dL — ABNORMAL HIGH (ref 70–99)
Glucose-Capillary: 308 mg/dL — ABNORMAL HIGH (ref 70–99)

## 2020-07-28 LAB — CBC WITH DIFFERENTIAL/PLATELET
Abs Immature Granulocytes: 0.32 10*3/uL — ABNORMAL HIGH (ref 0.00–0.07)
Basophils Absolute: 0.1 10*3/uL (ref 0.0–0.1)
Basophils Relative: 0 %
Eosinophils Absolute: 0.1 10*3/uL (ref 0.0–0.5)
Eosinophils Relative: 0 %
HCT: 34.1 % — ABNORMAL LOW (ref 39.0–52.0)
Hemoglobin: 10.7 g/dL — ABNORMAL LOW (ref 13.0–17.0)
Immature Granulocytes: 1 %
Lymphocytes Relative: 5 %
Lymphs Abs: 1.3 10*3/uL (ref 0.7–4.0)
MCH: 27.6 pg (ref 26.0–34.0)
MCHC: 31.4 g/dL (ref 30.0–36.0)
MCV: 87.9 fL (ref 80.0–100.0)
Monocytes Absolute: 0.8 10*3/uL (ref 0.1–1.0)
Monocytes Relative: 4 %
Neutro Abs: 20.7 10*3/uL — ABNORMAL HIGH (ref 1.7–7.7)
Neutrophils Relative %: 90 %
Platelets: 484 10*3/uL — ABNORMAL HIGH (ref 150–400)
RBC: 3.88 MIL/uL — ABNORMAL LOW (ref 4.22–5.81)
RDW: 13.8 % (ref 11.5–15.5)
WBC: 23.2 10*3/uL — ABNORMAL HIGH (ref 4.0–10.5)
nRBC: 0 % (ref 0.0–0.2)

## 2020-07-28 LAB — MAGNESIUM: Magnesium: 1.8 mg/dL (ref 1.7–2.4)

## 2020-07-28 LAB — BASIC METABOLIC PANEL
Anion gap: 13 (ref 5–15)
BUN: 12 mg/dL (ref 8–23)
CO2: 22 mmol/L (ref 22–32)
Calcium: 8 mg/dL — ABNORMAL LOW (ref 8.9–10.3)
Chloride: 94 mmol/L — ABNORMAL LOW (ref 98–111)
Creatinine, Ser: 0.69 mg/dL (ref 0.61–1.24)
GFR, Estimated: >60 mL/min (ref >60)
Glucose, Bld: 314 mg/dL — ABNORMAL HIGH (ref 70–99)
Potassium: 3.3 mmol/L — ABNORMAL LOW (ref 3.5–5.1)
Sodium: 129 mmol/L — ABNORMAL LOW (ref 135–145)

## 2020-07-28 MED ORDER — METHOCARBAMOL 1000 MG/10ML IJ SOLN
500.0000 mg | Freq: Four times a day (QID) | INTRAVENOUS | Status: DC | PRN
  Administered 2020-07-28 – 2020-07-30 (×6): 500 mg via INTRAVENOUS
  Filled 2020-07-28: qty 5
  Filled 2020-07-28 (×2): qty 500
  Filled 2020-07-28: qty 5
  Filled 2020-07-28 (×4): qty 500

## 2020-07-28 MED ORDER — OXYCODONE HCL 5 MG PO TABS
5.0000 mg | ORAL_TABLET | ORAL | Status: DC | PRN
  Administered 2020-07-28: 5 mg via ORAL
  Filled 2020-07-28: qty 1

## 2020-07-28 MED ORDER — VANCOMYCIN HCL IN DEXTROSE 1-5 GM/200ML-% IV SOLN
1000.0000 mg | Freq: Two times a day (BID) | INTRAVENOUS | Status: DC
  Administered 2020-07-28 – 2020-07-29 (×2): 1000 mg via INTRAVENOUS
  Filled 2020-07-28 (×2): qty 200

## 2020-07-28 MED ORDER — OXYCODONE HCL 5 MG PO TABS
10.0000 mg | ORAL_TABLET | Freq: Four times a day (QID) | ORAL | Status: DC | PRN
  Administered 2020-07-28 – 2020-07-31 (×7): 10 mg via ORAL
  Administered 2020-07-31: 5 mg via ORAL
  Administered 2020-07-31: 10 mg via ORAL
  Filled 2020-07-28 (×10): qty 2

## 2020-07-28 MED ORDER — VANCOMYCIN HCL 2000 MG/400ML IV SOLN
2000.0000 mg | INTRAVENOUS | Status: AC
  Administered 2020-07-28: 2000 mg via INTRAVENOUS
  Filled 2020-07-28: qty 400

## 2020-07-28 MED ORDER — INSULIN GLARGINE 100 UNIT/ML ~~LOC~~ SOLN
25.0000 [IU] | Freq: Every day | SUBCUTANEOUS | Status: DC
  Administered 2020-07-28: 25 [IU] via SUBCUTANEOUS
  Filled 2020-07-28 (×2): qty 0.25

## 2020-07-28 MED ORDER — INSULIN ASPART 100 UNIT/ML ~~LOC~~ SOLN
5.0000 [IU] | Freq: Three times a day (TID) | SUBCUTANEOUS | Status: DC
  Administered 2020-07-28 – 2020-07-31 (×10): 5 [IU] via SUBCUTANEOUS

## 2020-07-28 MED ORDER — ENSURE MAX PROTEIN PO LIQD
11.0000 [oz_av] | Freq: Two times a day (BID) | ORAL | Status: DC
  Administered 2020-07-28 – 2020-08-01 (×9): 11 [oz_av] via ORAL
  Filled 2020-07-28 (×9): qty 330

## 2020-07-28 MED ORDER — IBUPROFEN 200 MG PO TABS
600.0000 mg | ORAL_TABLET | Freq: Four times a day (QID) | ORAL | Status: DC | PRN
  Administered 2020-07-30 – 2020-08-01 (×7): 600 mg via ORAL
  Filled 2020-07-28 (×7): qty 3

## 2020-07-28 MED ORDER — POTASSIUM CHLORIDE CRYS ER 20 MEQ PO TBCR
40.0000 meq | EXTENDED_RELEASE_TABLET | Freq: Once | ORAL | Status: AC
  Administered 2020-07-28: 40 meq via ORAL
  Filled 2020-07-28: qty 2

## 2020-07-28 NOTE — Progress Notes (Signed)
Initial Nutrition Assessment  DOCUMENTATION CODES:   Obesity unspecified  INTERVENTION:   -Ensure Max BID, each provides 150 kcals and 30g protein  NUTRITION DIAGNOSIS:   Increased nutrient needs related to  (retroperitonael abscess) as evidenced by estimated needs.  GOAL:   Patient will meet greater than or equal to 90% of their needs  MONITOR:   PO intake, Labs, Weight trends, I & O's, Supplement acceptance  REASON FOR ASSESSMENT:   Malnutrition Screening Tool    ASSESSMENT:   64 year old male with no significant past medical history with who was recently placed on steroids for low back pain/sciatica who presented with complaints of abdominal pain on the right side.  He  also felt that there is some swelling on the right lower quadrant.  On presentation he was febrile, had leukocytosis, elevated blood glucose.  CT abdomen/pelvis was concerning for enhancing collection in the right pararenal space concerning for retroperitoneal abscess  10/23: s/p CT guided R retroperitoneal abscess drain placement  Patient currently consuming 100% of meals at this time. Pt was having poor appetite and abdominal pain PTA. Will order Ensure Max for additional protein.  Per weight records, pt has lost 8 lbs PTA. Weight records unable to provide time frame.   Medications: KLOR-CON Labs reviewed: CBGs: 288-308 Low Na, K  NUTRITION - FOCUSED PHYSICAL EXAM:  Unable to complete  Diet Order:   Diet Order            Diet Carb Modified Fluid consistency: Thin; Room service appropriate? Yes  Diet effective now                 EDUCATION NEEDS:   No education needs have been identified at this time  Skin:  Skin Assessment: Reviewed RN Assessment  Last BM:  PTA  Height:   Ht Readings from Last 1 Encounters:  07/26/20 6' (1.829 m)    Weight:   Wt Readings from Last 1 Encounters:  07/27/20 105.1 kg   BMI:  Body mass index is 31.42 kg/m.  Estimated Nutritional Needs:    Kcal:  2100-2300  Protein:  95-105g  Fluid:  2.1L/day  Clayton Bibles, MS, RD, LDN Inpatient Clinical Dietitian Contact information available via Amion

## 2020-07-28 NOTE — Progress Notes (Signed)
Pharmacy Antibiotic Note  Paul Estes is a 64 y.o. male admitted on 07/26/2020 with suspected retroperitoneal abscess. Patient initially placed on Zosyn. MD now added Vancomycin. Pharmacy has been consulted for Vancomycin dosing.  Plan: Vancomycin 2g IV x 1, then 1g IV q12h.  Vancomycin trough level at steady state, as indicated.  Zosyn 3.375g IV q8h (each dose infused over 4 hours).  Monitor renal function (daily SCr), cultures, clinical course.   Height: 6' (182.9 cm) Weight: 105.1 kg (231 lb 11.3 oz) IBW/kg (Calculated) : 77.6  Temp (24hrs), Avg:99.3 F (37.4 C), Min:98.9 F (37.2 C), Max:99.9 F (37.7 C)  Recent Labs  Lab 07/26/20 1243 07/26/20 1701 07/27/20 0612 07/28/20 0557  WBC 23.2*  --  24.3* 23.2*  CREATININE 0.71  --  0.79 0.69  LATICACIDVEN  --  1.0  --   --     Estimated Creatinine Clearance: 116.9 mL/min (by C-G formula based on SCr of 0.69 mg/dL).    No Known Allergies  Antimicrobials this admission: 10/22 Ceftriaxone x 1 10/22 Metronidazole x 1 10/23 Zosyn >> 10/24 Vancomycin >>  Dose adjustments this admission: --  Microbiology results: 10/22 BCx: NGTD 10/22 UCx: 60K Staph aureus 10/22 respiratory panel: negative 10/23 abscess: abundant Staph aureus  Thank you for allowing pharmacy to be a part of this patient's care.  Lindell Spar, PharmD, BCPS Clinical Pharmacist 07/28/2020 9:51 AM

## 2020-07-28 NOTE — Progress Notes (Signed)
PROGRESS NOTE    Paul Estes  GYI:[id number] DOB: 12/06/1955 DOA: 07/26/2020 PCP: [facility]   Chief Complain: Abdominal pain  Brief Narrative:  Patient is a 64 year old male with no significant past medical history with who was recently placed on steroids for low back pain/sciatica who presented with complaints of abdominal pain on the right side.  He  also felt that there is some swelling on the right lower quadrant.  On presentation he was febrile, had leukocytosis, elevated blood glucose.  CT abdomen/pelvis was concerning for enhancing collection in the right pararenal space concerning for retroperitoneal abscess.  Urology/IR consulted.  Started on empiric antibiotics.  He underwent IR guided retroperitoneal abscess drain catheteron 10/28/19.  cultures sent.    Assessment & Plan:   Principal Problem:   Abnormal CT of the abdomen Active Problems:   Flank pain   Retroperitoneal abscess involving the right pararenal space/renal abscess:There was also suspicion for  renal mass but given his significant leukocytosis, fever, abscess was  favored. Urology consulted and following.  IR also consulted he underwent retroperitoneal abscess drain catheter on 07/27/2020 and there was frank drainage of abscess of the drain.  We will follow-up cultures.  Wound culture showing gram-positive cocci.  Continue vancomycin and zosyn.  Blood cultures have been sent, will follow up. Currently he is afebrile but has severe leukocytosis.  Hyperglycemia: Most likely secondary to recent steroid use for back pain.  Hemoglobin A1c pensing.  No history of diabetes, continue sliding-scale insulin and lantus.  Monitor blood sugars  Scrotal edema: new problem.  Urology has been notified.  Ultrasound of the scrotum pending.  Iron deficiency normocytic anemia: Iron panel showed iron deficiency.  Given a 1 dose of IV iron  Low back pain: Was on steroids for sciatica at home.  Complains  of severe back pain with right lower extremity numbness/tingling.  Continue pain medications,muscle  relaxants  Hypokalemia: Being supplemented with potassium  Hyponatremia: Sodium of 129.  We will continue to monitor.             DVT prophylaxis:SCD Code Status: Full Family Communication: None at bed side Status is: Inpatient  Remains inpatient appropriate because:Inpatient level of care appropriate due to severity of illness   Dispo: The patient is from: Home              Anticipated d/c is to: Home              Anticipated d/c date is:1-2 days              Patient currently is not medically stable to d/c.  Still has severe leukocytosis, complaints of back pain, scrotal edema   Consultants: Urology,IR  Procedures:None  Antimicrobials:  Anti-infectives (From admission, onward)   Start     Dose/Rate Route Frequency Ordered Stop   07/27/20 0600  piperacillin-tazobactam (ZOSYN) IVPB 3.375 g        3.375 g 12.5 mL/hr over 240 Minutes Intravenous Every 8 hours 07/27/20 0506     07/26/20 1345  cefTRIAXone (ROCEPHIN) 2 g in sodium chloride 0.9 % 100 mL IVPB       "And" Linked Group Details   2 g 200 mL/hr over 30 Minutes Intravenous  Once 07/26/20 1336 07/26/20 1444   07/26/20 1345  metroNIDAZOLE (FLAGYL) IVPB 500 mg       "And" Linked Group Details   500 mg 100 mL/hr over 60 Minutes Intravenous  Once 07/26/20 1336 07/26/20 1653  Subjective: Patient seen and examined at bedside this morning.  Complains of severe back pain today.  I was notified  after my evaluation that his scrotum got swollen.  Currently he is afebrile.  Objective: Vitals:   07/27/20 1214 07/27/20 1504 07/27/20 2015 07/28/20 0613  BP: (!) 156/90 (!) 151/86 (!) 153/71 (!) 164/89  Pulse: 87 90 95 91  Resp: (!) 22 (!) 22 16 18   Temp: 98.9 F (37.2 C) 99 F (37.2 C) 99.9 F (37.7 C) 99.4 F (37.4 C)  TempSrc: Oral Oral Oral Oral  SpO2: 91% 93% 90% 90%  Weight:      Height:         Intake/Output Summary (Last 24 hours) at 07/28/2020 0852 Last data filed at 07/28/2020 0616 Gross per 24 hour  Intake 970 ml  Output 2030 ml  Net -1060 ml   Filed Weights   07/26/20 1204 07/27/20 0229  Weight: 106.1 kg 105.1 kg    Examination:  General exam: Uncomfortable due to back pain, obese HEENT:PERRL,Oral mucosa moist, Ear/Nose normal on gross exam Respiratory system: Bilateral equal air entry, normal vesicular breath sounds, no wheezes or crackles  Cardiovascular system: S1 & S2 heard, RRR. No JVD, murmurs, rubs, gallops or clicks. No pedal edema. Gastrointestinal system: Abdomen is nondistended, soft and nontender. No organomegaly or masses felt. Normal bowel sounds heard. Right costovertebral angle drain with minimal output  Central nervous system: Alert and oriented. No focal neurological deficits. Extremities: No edema, no clubbing ,no cyanosis   Data Reviewed: I have personally reviewed following labs and imaging studies  CBC: Recent Labs  Lab 07/26/20 1243 07/27/20 0612 07/28/20 0557  WBC 23.2* 24.3* 23.2*  NEUTROABS 20.9* 22.0* 20.7*  HGB 10.6* 10.9* 10.7*  HCT 32.7* 34.1* 34.1*  MCV 84.5 88.1 87.9  PLT 442* 474* 466*   Basic Metabolic Panel: Recent Labs  Lab 07/26/20 1243 07/27/20 0612 07/28/20 0557  NA 130* 135 129*  K 3.8 3.7 3.3*  CL 93* 101 94*  CO2 22 18* 22  GLUCOSE 386* 307* 314*  BUN 16 15 12   CREATININE 0.71 0.79 0.69  CALCIUM 8.4* 8.3* 8.0*   GFR: Estimated Creatinine Clearance: 116.9 mL/min (by C-G formula based on SCr of 0.69 mg/dL). Liver Function Tests: Recent Labs  Lab 07/26/20 1243 07/27/20 0612  AST 12* 9*  ALT 13 11  ALKPHOS 122 127*  BILITOT 0.7 1.4*  PROT 7.2 7.0  ALBUMIN 2.3* 2.4*   Recent Labs  Lab 07/26/20 1243  LIPASE 18   No results for input(s): AMMONIA in the last 168 hours. Coagulation Profile: Recent Labs  Lab 07/26/20 1701 07/27/20 0612  INR 1.3* 1.3*   Cardiac Enzymes: No results for  input(s): CKTOTAL, CKMB, CKMBINDEX, TROPONINI in the last 168 hours. BNP (last 3 results) No results for input(s): PROBNP in the last 8760 hours. HbA1C: No results for input(s): HGBA1C in the last 72 hours. CBG: Recent Labs  Lab 07/27/20 1210 07/27/20 1742 07/27/20 2017 07/28/20 0011 07/28/20 0611  GLUCAP 265* 343* 347* 288* 308*   Lipid Profile: No results for input(s): CHOL, HDL, LDLCALC, TRIG, CHOLHDL, LDLDIRECT in the last 72 hours. Thyroid Function Tests: No results for input(s): TSH, T4TOTAL, FREET4, T3FREE, THYROIDAB in the last 72 hours. Anemia Panel: Recent Labs    07/27/20 0612  VITAMINB12 524  FOLATE 15.7  FERRITIN 775*  TIBC 175*  IRON 23*  RETICCTPCT 1.9   Sepsis Labs: Recent Labs  Lab 07/26/20 1701  LATICACIDVEN 1.0  Recent Results (from the past 240 hour(s))  Urine culture     Status: Abnormal (Preliminary result)   Collection Time: 07/26/20 12:44 PM   Specimen: Urine, Random  Result Value Ref Range Status   Specimen Description   Final    URINE, RANDOM Performed at [facility], [address] [place], [place] [address]    Special Requests   Final    NONE Performed at [facility], [address] [place], [place] [address]    Culture (A)  Final    60,000 COLONIES/mL STAPHYLOCOCCUS AUREUS SUSCEPTIBILITIES TO FOLLOW Performed at [facility], [place] [address] [place], [place] [address]    Report Status PENDING  Incomplete  Respiratory Panel by RT PCR (Flu A&B, Covid) - Nasopharyngeal Swab     Status: None   Collection Time: 07/26/20  4:45 PM   Specimen: Nasopharyngeal Swab  Result Value Ref Range Status   SARS Coronavirus 2 by RT PCR NEGATIVE NEGATIVE Final    Comment: (NOTE) SARS-CoV-2 target nucleic acids are NOT DETECTED.  The SARS-CoV-2 RNA is generally detectable in upper respiratoy specimens during the acute phase of infection. The lowest concentration of SARS-CoV-2 viral copies this assay can detect  is 131 copies/mL. A negative result does not preclude SARS-Cov-2 infection and should not be used as the sole basis for treatment or other patient management decisions. A negative result may occur with  improper specimen collection/handling, submission of specimen other than nasopharyngeal swab, presence of viral mutation(s) within the areas targeted by this assay, and inadequate number of viral copies (<131 copies/mL). A negative result must be combined with clinical observations, patient history, and epidemiological information. The expected result is Negative.  Fact Sheet for Patients:  PinkCheek.be  Fact Sheet for Healthcare Providers:  GravelBags.it  This test is no t yet approved or cleared by the [place] FDA and  has been authorized for detection and/or diagnosis of SARS-CoV-2 by FDA under an Emergency Use Authorization (EUA). This EUA will remain  in effect (meaning this test can be used) for the duration of the COVID-19 declaration under Section 564(b)(1) of the Act, 21 U.S.C. section 360bbb-3(b)(1), unless the authorization is terminated or revoked sooner.     Influenza A by PCR NEGATIVE NEGATIVE Final   Influenza B by PCR NEGATIVE NEGATIVE Final    Comment: (NOTE) The Xpert Xpress SARS-CoV-2/FLU/RSV assay is intended as an aid in  the diagnosis of influenza from Nasopharyngeal swab specimens and  should not be used as a sole basis for treatment. Nasal washings and  aspirates are unacceptable for Xpert Xpress SARS-CoV-2/FLU/RSV  testing.  Fact Sheet for Patients: PinkCheek.be  Fact Sheet for Healthcare Providers: GravelBags.it  This test is not yet approved or cleared by the [place] FDA and  has been authorized for detection and/or diagnosis of SARS-CoV-2 by  FDA under an Emergency Use Authorization (EUA). This EUA will remain  in effect  (meaning this test can be used) for the duration of the  Covid-19 declaration under Section 564(b)(1) of the Act, 21  U.S.C. section 360bbb-3(b)(1), unless the authorization is  terminated or revoked. Performed at [facility], [address] [place], [place] [address]   Culture, blood (routine x 2)     Status: None (Preliminary result)   Collection Time: 07/26/20  5:55 PM   Specimen: BLOOD RIGHT HAND  Result Value Ref Range Status   Specimen Description   Final  BLOOD RIGHT HAND Performed at [facility] [address] [place], [place] [address]    Special Requests   Final    BOTTLES DRAWN AEROBIC AND ANAEROBIC Blood Culture adequate volume Performed at [facility], [address] [place], [place] [address]    Culture   Final    NO GROWTH < 12 HOURS Performed at [facility], [place] [address] [place], [place] [address]    Report Status PENDING  Incomplete  Culture, blood (routine x 2)     Status: None (Preliminary result)   Collection Time: 07/26/20  6:10 PM   Specimen: BLOOD  Result Value Ref Range Status   Specimen Description   Final    BLOOD LEFT ANTECUBITAL Performed at [facility], [place] [address] [place], [place] [address]    Special Requests   Final    BOTTLES DRAWN AEROBIC AND ANAEROBIC Blood Culture adequate volume Performed at [facility], [address] [place], [place] [address]    Culture   Final    NO GROWTH < 12 HOURS Performed at [facility] [address], [place], [place] [address]    Report Status PENDING  Incomplete  Aerobic/Anaerobic Culture (surgical/deep wound)     Status: None (Preliminary result)   Collection Time: 07/27/20 10:42 AM   Specimen: Abscess  Result Value Ref Range Status   Specimen Description   Final    ABSCESS Performed at [facility] [address], [place], [place] [address]    Special Requests   Final    NONE Performed at [facility], [place] [address] [place], [place] [address]    Gram Stain   Final    ABUNDANT WBC PRESENT, PREDOMINANTLY PMN ABUNDANT GRAM POSITIVE COCCI IN PAIRS IN CLUSTERS Performed at [facility], [place] [address] [place], [place] [address]    Culture PENDING  Incomplete   Report Status PENDING  Incomplete         Radiology Studies: CT ABDOMEN PELVIS W WO CONTRAST  Result Date: 07/27/2020 CLINICAL DATA:  Perinephric abscess. EXAM: CT ABDOMEN AND PELVIS WITHOUT AND WITH CONTRAST TECHNIQUE: Multidetector CT imaging of the abdomen and pelvis was performed following the standard protocol before and following the bolus administration of intravenous contrast. The patient was scanned prone due to subsequent interventional procedure. CONTRAST:  165mL OMNIPAQUE IOHEXOL 300 MG/ML  SOLN COMPARISON:  the previous day's study FINDINGS: Lower chest: No acute abnormality. Hepatobiliary: Persistent ill-defined low-attenuation region posteriorly in hepatic segment 4 B suggesting focal fatty infiltration, approximately 3 x 2 cm. No other liver lesion or biliary ductal dilatation. Gallbladder unremarkable. Pancreas: Unremarkable. No pancreatic ductal dilatation or surrounding inflammatory changes. Spleen: Normal in size without focal abnormality. Adrenals/Urinary Tract: Normal adrenals. 4.7 cm simple cyst, exophytic from upper pole left kidney. Scattered peripheral wedge-shaped hyperdense regions in both kidneys on the precontrast study, more numerous right than left, possibly related to previous day's contrast administration. There is a wedge-shaped area of hypoperfusion in the lower pole right kidney contiguous with the perinephric and posterior retroperitoneal complex process. The process shows peripheral enhancement and loculated central low attenuation, abuts the psoas, and extends inferiorly through the retroperitoneum into the pelvis as before. No other perfusion defects in the kidneys are  identified. There is no hydronephrosis. No urolithiasis. Urinary bladder physiologically distended. Stomach/Bowel: Stomach decompressed. Small bowel is nondistended. Normal appendix. Colon is nondilated, unremarkable. Vascular/Lymphatic:  Mild atheromatous plaque in the infrarenal aorta. No aneurysm, dissection, or stenosis. Reproductive: Prostate is unremarkable. Other: No ascites.  No free air. Musculoskeletal: No acute or significant osseous findings. IMPRESSION: 1. No significant change in wedge-shaped area of hypoperfusion in the lower pole right kidney contiguous with the perinephric and posterior retroperitoneal complex process. Favor infectious/inflammatory process over neoplasm. Follow-up recommended to confirm appropriate resolution. 2. Scattered wedge-shaped hyperdense regions in both kidneys on the precontrast study, more numerous right than left, possibly related to previous day's contrast administration. Correlate with any clinical or laboratory evidence of pyelonephritis. 3. Persistent ill-defined low-attenuation region posteriorly in hepatic segment 4 B, suggesting focal fatty infiltration. Consider elective outpatient liver MR with contrast for more definitive characterization. Aortic Atherosclerosis (ICD10-I70.0). Electronically Signed   By: Lucrezia Europe M.D.   On: 07/27/2020 13:00   CT Abdomen Pelvis W Contrast  Result Date: 07/26/2020 CLINICAL DATA:  Right lower quadrant abdominal pain EXAM: CT ABDOMEN AND PELVIS WITH CONTRAST TECHNIQUE: Multidetector CT imaging of the abdomen and pelvis was performed using the standard protocol following bolus administration of intravenous contrast. CONTRAST:  165mL OMNIPAQUE IOHEXOL 300 MG/ML  SOLN COMPARISON:  None. FINDINGS: Lower chest: Visualized lung bases are clear. Visualized heart and pericardium are unremarkable peer Hepatobiliary: Mild hepatomegaly. No focal liver lesion. No intra or extrahepatic biliary ductal dilation. Gallbladder unremarkable.  Pancreas: Unremarkable. Spleen: Moderate splenomegaly with the spleen measuring 17 cm in greatest dimension. No intrasplenic lesion identified. The splenic vein is patent. Adrenals/Urinary Tract: The adrenal glands are unremarkable. There is a heterogeneously enhancing collection seen within the posterior right pararenal space intimately associated with the posterior, inferior pole of the left kidney which demonstrates hypoenhancement on delayed images. This measures 9.2 x 5.0 cm on axial image # 38. There is no extravasation of contrast noted on delayed images. Together, the findings may reflect focal pyelonephritis with retroperitoneal abscess formation, retroperitoneal hemorrhage secondary to an underlying renal mass, or a retroperitoneal urinoma, though the latter is considered less likely given the lack of contrast extravasation. There is extension of this inflammatory or hemorrhagic process into the retroperitoneal space lateral to the right kidney and subjacent to the transversus abdominus musculature. Hypoenhancement of the lower pole of the right kidney is noted on delayed images. The kidneys are otherwise normal in size and position and demonstrate normal cortical enhancement. Simple cortical cyst within the left kidney. No hydronephrosis. No intrarenal or ureteral calculi. Bladder unremarkable. Stomach/Bowel: The stomach, small bowel, and large bowel are unremarkable. Appendix normal. No free intraperitoneal gas or fluid. Vascular/Lymphatic: Shotty pericaval adenopathy is present, likely reactive. No frankly pathologic adenopathy within the abdomen and pelvis. The abdominal vasculature is unremarkable. Reproductive: Prostate is unremarkable. Other: Rectum unremarkable. Musculoskeletal: No acute bone abnormality. IMPRESSION: Irregularly enhancing collection within the right posterior pararenal space intimately associated with the posteroinferior pole of the right kidney extending into the right psoas and  subsequently into the retroperitoneal space subjacent to the right transverse abdominus musculature. Differential considerations include hemorrhage related to an underlying renal mass, a retroperitoneal abscess related to focal pyonephrosis, or a chronic urinoma though no definite leakage of urine is seen at this time on delayed images. There is associated hypoenhancement of a the adjacent renal cortex again favoring a renal etiology. Among the differential considerations, retroperitoneal abscess is considered most likely. Exclusion of an underlying mass could be performed with dedicated renal mass protocol CT or MRI imaging. Electronically Signed   By: Fidela Salisbury MD   On: 07/26/2020  16:01   CT IMAGE GUIDED DRAINAGE BY PERCUTANEOUS CATHETER  Result Date: 07/27/2020 CLINICAL DATA:  Retroperitoneal abscess EXAM: CT GUIDED DRAINAGE OF RETROPERITONEAL ABSCESS ANESTHESIA/SEDATION: Intravenous Fentanyl 21mcg and Versed 1.5mg  were administered as conscious sedation during continuous monitoring of the patient's level of consciousness and physiological / cardiorespiratory status by the radiology RN, with a total moderate sedation time of 14 minutes. PROCEDURE: The procedure, risks, benefits, and alternatives were explained to the patient. Questions regarding the procedure were encouraged and answered. The patient understands and consents to the procedure. Patient had previously been placed prone for diagnostic CT, reported separately. The collection was localized and an appropriate skin entry site was determined and marked. The operative field was prepped with chlorhexidinein a sterile fashion, and a sterile drape was applied covering the operative field. A sterile gown and sterile gloves were used for the procedure. Local anesthesia was provided with 1% Lidocaine. Under CT fluoroscopic guidance, 18 gauge trocar needle advanced in the collection. Purulent material could be aspirated. Amplatz wire advanced easily  within the collection, confirmed on CT fluoro. Tract dilated to facilitate placement of a 12 French pigtail drain catheter, formed centrally within the collection. 30 mL of purulent material were aspirated, sent for Gram stain and culture. Catheter secured externally with 0 Prolene suture and StatLock and placed to gravity drain bag. The patient tolerated the procedure well. COMPLICATIONS: None immediate FINDINGS: Complex loculated right retroperitoneal fluid collection was localized. 12 French pigtail drain catheter placed as above. 30 mL purulent aspirate sent for Gram stain and culture. IMPRESSION: Technically successful CT-guided right retroperitoneal abscess drain catheter placement. Electronically Signed   By: Lucrezia Europe M.D.   On: 07/27/2020 13:02        Scheduled Meds: . insulin aspart  0-9 Units Subcutaneous Q4H  . insulin aspart  5 Units Subcutaneous TID WC  . insulin glargine  25 Units Subcutaneous Daily  . potassium chloride  40 mEq Oral Once  . sodium chloride flush  5 mL Intracatheter Q8H   Continuous Infusions: . piperacillin-tazobactam (ZOSYN)  IV 3.375 g (07/28/20 0536)     LOS: 2 days    Time spent: 35 mins.More than 50% of that time was spent in counseling and/or coordination of care.      Shelly Coss, MD [facility] P10/24/2021, 8:52 AM

## 2020-07-28 NOTE — Progress Notes (Signed)
Subjective: The patient reports right flank pain and intermittent chills overnight following drain placement.  He has remained afebrile, but continues to have a leukocytosis.  Purulent fluid was aspirated following IR drain placement.  CT with contrast yesterday did not reveal any evidence of a right renal mass  Objective: Vital signs in last 24 hours: Temp:  [98.9 F (37.2 C)-99.9 F (37.7 C)] 99.4 F (37.4 C) (10/24 0613) Pulse Rate:  [87-95] 91 (10/24 0613) Resp:  [16-22] 18 (10/24 0613) BP: (151-164)/(71-90) 164/89 (10/24 0613) SpO2:  [90 %-93 %] 90 % (10/24 8366)  Intake/Output from previous day: 10/23 0701 - 10/24 0700 In: 37 [P.O.:960] Out: 2380 [Urine:2300; Drains:80]  Intake/Output this shift: Total I/O In: 245 [P.O.:240; Other:5] Out: 66 [Urine:400; Drains:20]  Physical Exam:  General: Alert and oriented GU: Right percutaneous retroperitoneal drain in place with purulent output  Lab Results: Recent Labs    07/26/20 1243 07/27/20 0612 07/28/20 0557  HGB 10.6* 10.9* 10.7*  HCT 32.7* 34.1* 34.1*   BMET Recent Labs    07/27/20 0612 07/28/20 0557  NA 135 129*  K 3.7 3.3*  CL 101 94*  CO2 18* 22  GLUCOSE 307* 314*  BUN 15 12  CREATININE 0.79 0.69  CALCIUM 8.3* 8.0*     Studies/Results: CT ABDOMEN PELVIS W WO CONTRAST  Result Date: 07/27/2020 CLINICAL DATA:  Perinephric abscess. EXAM: CT ABDOMEN AND PELVIS WITHOUT AND WITH CONTRAST TECHNIQUE: Multidetector CT imaging of the abdomen and pelvis was performed following the standard protocol before and following the bolus administration of intravenous contrast. The patient was scanned prone due to subsequent interventional procedure. CONTRAST:  178mL OMNIPAQUE IOHEXOL 300 MG/ML  SOLN COMPARISON:  the previous day's study FINDINGS: Lower chest: No acute abnormality. Hepatobiliary: Persistent ill-defined low-attenuation region posteriorly in hepatic segment 4 B suggesting focal fatty infiltration,  approximately 3 x 2 cm. No other liver lesion or biliary ductal dilatation. Gallbladder unremarkable. Pancreas: Unremarkable. No pancreatic ductal dilatation or surrounding inflammatory changes. Spleen: Normal in size without focal abnormality. Adrenals/Urinary Tract: Normal adrenals. 4.7 cm simple cyst, exophytic from upper pole left kidney. Scattered peripheral wedge-shaped hyperdense regions in both kidneys on the precontrast study, more numerous right than left, possibly related to previous day's contrast administration. There is a wedge-shaped area of hypoperfusion in the lower pole right kidney contiguous with the perinephric and posterior retroperitoneal complex process. The process shows peripheral enhancement and loculated central low attenuation, abuts the psoas, and extends inferiorly through the retroperitoneum into the pelvis as before. No other perfusion defects in the kidneys are identified. There is no hydronephrosis. No urolithiasis. Urinary bladder physiologically distended. Stomach/Bowel: Stomach decompressed. Small bowel is nondistended. Normal appendix. Colon is nondilated, unremarkable. Vascular/Lymphatic: Mild atheromatous plaque in the infrarenal aorta. No aneurysm, dissection, or stenosis. Reproductive: Prostate is unremarkable. Other: No ascites.  No free air. Musculoskeletal: No acute or significant osseous findings. IMPRESSION: 1. No significant change in wedge-shaped area of hypoperfusion in the lower pole right kidney contiguous with the perinephric and posterior retroperitoneal complex process. Favor infectious/inflammatory process over neoplasm. Follow-up recommended to confirm appropriate resolution. 2. Scattered wedge-shaped hyperdense regions in both kidneys on the precontrast study, more numerous right than left, possibly related to previous day's contrast administration. Correlate with any clinical or laboratory evidence of pyelonephritis. 3. Persistent ill-defined  low-attenuation region posteriorly in hepatic segment 4 B, suggesting focal fatty infiltration. Consider elective outpatient liver MR with contrast for more definitive characterization. Aortic Atherosclerosis (ICD10-I70.0). Electronically Signed   By: Keturah Barre  Vernard Gambles M.D.   On: 07/27/2020 13:00   CT Abdomen Pelvis W Contrast  Result Date: 07/26/2020 CLINICAL DATA:  Right lower quadrant abdominal pain EXAM: CT ABDOMEN AND PELVIS WITH CONTRAST TECHNIQUE: Multidetector CT imaging of the abdomen and pelvis was performed using the standard protocol following bolus administration of intravenous contrast. CONTRAST:  12mL OMNIPAQUE IOHEXOL 300 MG/ML  SOLN COMPARISON:  None. FINDINGS: Lower chest: Visualized lung bases are clear. Visualized heart and pericardium are unremarkable peer Hepatobiliary: Mild hepatomegaly. No focal liver lesion. No intra or extrahepatic biliary ductal dilation. Gallbladder unremarkable. Pancreas: Unremarkable. Spleen: Moderate splenomegaly with the spleen measuring 17 cm in greatest dimension. No intrasplenic lesion identified. The splenic vein is patent. Adrenals/Urinary Tract: The adrenal glands are unremarkable. There is a heterogeneously enhancing collection seen within the posterior right pararenal space intimately associated with the posterior, inferior pole of the left kidney which demonstrates hypoenhancement on delayed images. This measures 9.2 x 5.0 cm on axial image # 38. There is no extravasation of contrast noted on delayed images. Together, the findings may reflect focal pyelonephritis with retroperitoneal abscess formation, retroperitoneal hemorrhage secondary to an underlying renal mass, or a retroperitoneal urinoma, though the latter is considered less likely given the lack of contrast extravasation. There is extension of this inflammatory or hemorrhagic process into the retroperitoneal space lateral to the right kidney and subjacent to the transversus abdominus musculature.  Hypoenhancement of the lower pole of the right kidney is noted on delayed images. The kidneys are otherwise normal in size and position and demonstrate normal cortical enhancement. Simple cortical cyst within the left kidney. No hydronephrosis. No intrarenal or ureteral calculi. Bladder unremarkable. Stomach/Bowel: The stomach, small bowel, and large bowel are unremarkable. Appendix normal. No free intraperitoneal gas or fluid. Vascular/Lymphatic: Shotty pericaval adenopathy is present, likely reactive. No frankly pathologic adenopathy within the abdomen and pelvis. The abdominal vasculature is unremarkable. Reproductive: Prostate is unremarkable. Other: Rectum unremarkable. Musculoskeletal: No acute bone abnormality. IMPRESSION: Irregularly enhancing collection within the right posterior pararenal space intimately associated with the posteroinferior pole of the right kidney extending into the right psoas and subsequently into the retroperitoneal space subjacent to the right transverse abdominus musculature. Differential considerations include hemorrhage related to an underlying renal mass, a retroperitoneal abscess related to focal pyonephrosis, or a chronic urinoma though no definite leakage of urine is seen at this time on delayed images. There is associated hypoenhancement of a the adjacent renal cortex again favoring a renal etiology. Among the differential considerations, retroperitoneal abscess is considered most likely. Exclusion of an underlying mass could be performed with dedicated renal mass protocol CT or MRI imaging. Electronically Signed   By: Fidela Salisbury MD   On: 07/26/2020 16:01   CT IMAGE GUIDED DRAINAGE BY PERCUTANEOUS CATHETER  Result Date: 07/27/2020 CLINICAL DATA:  Retroperitoneal abscess EXAM: CT GUIDED DRAINAGE OF RETROPERITONEAL ABSCESS ANESTHESIA/SEDATION: Intravenous Fentanyl 47mcg and Versed 1.5mg  were administered as conscious sedation during continuous monitoring of the  patient's level of consciousness and physiological / cardiorespiratory status by the radiology RN, with a total moderate sedation time of 14 minutes. PROCEDURE: The procedure, risks, benefits, and alternatives were explained to the patient. Questions regarding the procedure were encouraged and answered. The patient understands and consents to the procedure. Patient had previously been placed prone for diagnostic CT, reported separately. The collection was localized and an appropriate skin entry site was determined and marked. The operative field was prepped with chlorhexidinein a sterile fashion, and a sterile drape was applied covering  the operative field. A sterile gown and sterile gloves were used for the procedure. Local anesthesia was provided with 1% Lidocaine. Under CT fluoroscopic guidance, 18 gauge trocar needle advanced in the collection. Purulent material could be aspirated. Amplatz wire advanced easily within the collection, confirmed on CT fluoro. Tract dilated to facilitate placement of a 12 French pigtail drain catheter, formed centrally within the collection. 30 mL of purulent material were aspirated, sent for Gram stain and culture. Catheter secured externally with 0 Prolene suture and StatLock and placed to gravity drain bag. The patient tolerated the procedure well. COMPLICATIONS: None immediate FINDINGS: Complex loculated right retroperitoneal fluid collection was localized. 12 French pigtail drain catheter placed as above. 30 mL purulent aspirate sent for Gram stain and culture. IMPRESSION: Technically successful CT-guided right retroperitoneal abscess drain catheter placement. Electronically Signed   By: Lucrezia Europe M.D.   On: 07/27/2020 13:02    Assessment/Plan: 64 year old male with a large right retroperitoneal/perinephric abscess status post percutaneous drain placement on 07/27/2020  -Abscess, blood and urine cultures are pending.  Continue antibiotic coverage with Zosyn   LOS: 2  days   Ellison Hughs, MD [facility] Pager: [phone]  07/28/2020, 11:51 AM

## 2020-07-29 ENCOUNTER — Inpatient Hospital Stay (HOSPITAL_COMMUNITY): Payer: 59

## 2020-07-29 DIAGNOSIS — R935 Abnormal findings on diagnostic imaging of other abdominal regions, including retroperitoneum: Secondary | ICD-10-CM | POA: Diagnosis not present

## 2020-07-29 LAB — CBC WITH DIFFERENTIAL/PLATELET
Abs Immature Granulocytes: 0.37 10*3/uL — ABNORMAL HIGH (ref 0.00–0.07)
Basophils Absolute: 0 10*3/uL (ref 0.0–0.1)
Basophils Relative: 0 %
Eosinophils Absolute: 0.1 10*3/uL (ref 0.0–0.5)
Eosinophils Relative: 0 %
HCT: 33.9 % — ABNORMAL LOW (ref 39.0–52.0)
Hemoglobin: 11.2 g/dL — ABNORMAL LOW (ref 13.0–17.0)
Immature Granulocytes: 2 %
Lymphocytes Relative: 6 %
Lymphs Abs: 1.3 10*3/uL (ref 0.7–4.0)
MCH: 28 pg (ref 26.0–34.0)
MCHC: 33 g/dL (ref 30.0–36.0)
MCV: 84.8 fL (ref 80.0–100.0)
Monocytes Absolute: 0.9 10*3/uL (ref 0.1–1.0)
Monocytes Relative: 4 %
Neutro Abs: 19.6 10*3/uL — ABNORMAL HIGH (ref 1.7–7.7)
Neutrophils Relative %: 88 %
Platelets: 459 10*3/uL — ABNORMAL HIGH (ref 150–400)
RBC: 4 MIL/uL — ABNORMAL LOW (ref 4.22–5.81)
RDW: 13.5 % (ref 11.5–15.5)
WBC: 22.2 10*3/uL — ABNORMAL HIGH (ref 4.0–10.5)
nRBC: 0 % (ref 0.0–0.2)

## 2020-07-29 LAB — BASIC METABOLIC PANEL
Anion gap: 11 (ref 5–15)
BUN: 12 mg/dL (ref 8–23)
CO2: 25 mmol/L (ref 22–32)
Calcium: 8.1 mg/dL — ABNORMAL LOW (ref 8.9–10.3)
Chloride: 96 mmol/L — ABNORMAL LOW (ref 98–111)
Creatinine, Ser: 0.56 mg/dL — ABNORMAL LOW (ref 0.61–1.24)
GFR, Estimated: >60 mL/min (ref >60)
Glucose, Bld: 251 mg/dL — ABNORMAL HIGH (ref 70–99)
Potassium: 3.3 mmol/L — ABNORMAL LOW (ref 3.5–5.1)
Sodium: 132 mmol/L — ABNORMAL LOW (ref 135–145)

## 2020-07-29 LAB — GLUCOSE, CAPILLARY
Glucose-Capillary: 291 mg/dL — ABNORMAL HIGH (ref 70–99)
Glucose-Capillary: 293 mg/dL — ABNORMAL HIGH (ref 70–99)
Glucose-Capillary: 235 mg/dL — ABNORMAL HIGH (ref 70–99)
Glucose-Capillary: 258 mg/dL — ABNORMAL HIGH (ref 70–99)
Glucose-Capillary: 280 mg/dL — ABNORMAL HIGH (ref 70–99)
Glucose-Capillary: 249 mg/dL — ABNORMAL HIGH (ref 70–99)
Glucose-Capillary: 293 mg/dL — ABNORMAL HIGH (ref 70–99)

## 2020-07-29 LAB — URINE CULTURE: Culture: 60000 — AB

## 2020-07-29 LAB — HEMOGLOBIN A1C
Hgb A1c MFr Bld: 14.3 % — ABNORMAL HIGH (ref 4.8–5.6)
Hgb A1c MFr Bld: 14.3 % — ABNORMAL HIGH (ref 4.8–5.6)
Mean Plasma Glucose: 364 mg/dL
Mean Plasma Glucose: 364 mg/dL

## 2020-07-29 MED ORDER — SODIUM CHLORIDE 0.9 % IV SOLN
3.0000 g | Freq: Four times a day (QID) | INTRAVENOUS | Status: DC
  Administered 2020-07-29 – 2020-07-31 (×7): 3 g via INTRAVENOUS
  Filled 2020-07-29 (×6): qty 3
  Filled 2020-07-29 (×2): qty 8

## 2020-07-29 MED ORDER — POTASSIUM CHLORIDE CRYS ER 20 MEQ PO TBCR
40.0000 meq | EXTENDED_RELEASE_TABLET | Freq: Once | ORAL | Status: AC
  Administered 2020-07-29: 40 meq via ORAL
  Filled 2020-07-29: qty 2

## 2020-07-29 MED ORDER — IOHEXOL 300 MG/ML  SOLN
100.0000 mL | Freq: Once | INTRAMUSCULAR | Status: AC | PRN
  Administered 2020-07-29: 100 mL via INTRAVENOUS

## 2020-07-29 MED ORDER — INSULIN GLARGINE 100 UNIT/ML ~~LOC~~ SOLN
40.0000 [IU] | Freq: Every day | SUBCUTANEOUS | Status: DC
  Administered 2020-07-29: 40 [IU] via SUBCUTANEOUS
  Filled 2020-07-29 (×2): qty 0.4

## 2020-07-29 MED ORDER — INSULIN STARTER KIT- PEN NEEDLES (ENGLISH)
1.0000 | Freq: Once | Status: AC
  Administered 2020-07-29: 1
  Filled 2020-07-29: qty 1

## 2020-07-29 MED ORDER — INSULIN ASPART 100 UNIT/ML ~~LOC~~ SOLN
0.0000 [IU] | Freq: Three times a day (TID) | SUBCUTANEOUS | Status: DC
  Administered 2020-07-29: 8 [IU] via SUBCUTANEOUS
  Administered 2020-07-30 (×3): 5 [IU] via SUBCUTANEOUS
  Administered 2020-07-31 – 2020-08-01 (×4): 3 [IU] via SUBCUTANEOUS

## 2020-07-29 MED ORDER — INSULIN ASPART 100 UNIT/ML ~~LOC~~ SOLN
0.0000 [IU] | Freq: Every day | SUBCUTANEOUS | Status: DC
  Administered 2020-07-29: 3 [IU] via SUBCUTANEOUS
  Administered 2020-07-30: 2 [IU] via SUBCUTANEOUS

## 2020-07-29 MED ORDER — LEVOFLOXACIN IN D5W 750 MG/150ML IV SOLN
750.0000 mg | INTRAVENOUS | Status: DC
  Administered 2020-07-29: 750 mg via INTRAVENOUS
  Filled 2020-07-29: qty 150

## 2020-07-29 MED ORDER — METRONIDAZOLE IN NACL 5-0.79 MG/ML-% IV SOLN
500.0000 mg | Freq: Three times a day (TID) | INTRAVENOUS | Status: DC
  Administered 2020-07-29: 500 mg via INTRAVENOUS
  Filled 2020-07-29: qty 100

## 2020-07-29 MED ORDER — LIVING WELL WITH DIABETES BOOK
Freq: Once | Status: AC
  Filled 2020-07-29: qty 1

## 2020-07-29 NOTE — Progress Notes (Signed)
PT Cancellation Note  Patient Details Name: Paul Estes MRN: [id number] DOB: 07-31-1956   Cancelled Treatment:    Reason Eval/Treat Not Completed: Patient at procedure or test/unavailable   [facility] 07/29/2020, 10:45 AM

## 2020-07-29 NOTE — Progress Notes (Signed)
Subjective: Clinically worsening despite drain placement and IV zosyn.  Persistent leukocytosis and undulant fevers.  Developed right testicular pain yesterday afternoon.  Scrotal US showed inflammation tracking down the right inguinal canal. Scant output from right retroperitoneal drain.  Blood glucose continues to be poorly controlled.   Objective: Vital signs in last 24 hours: Temp:  [98 F (36.7 C)-100.7 F (38.2 C)] 99.3 F (37.4 C) (10/25 0628) Pulse Rate:  [90-92] 90 (10/25 0628) Resp:  [18] 18 (10/24 1423) BP: (145-159)/(83-90) 147/88 (10/25 0628) SpO2:  [90 %-92 %] 91 % (10/25 0628)  Intake/Output from previous day: 10/24 0701 - 10/25 0700 In: 2000 [P.O.:1362; IV Piggyback:628] Out: 1825 [Urine:1800; Drains:25]  Intake/Output this shift: No intake/output data recorded.  Physical Exam:  General: Alert and oriented CV: RRR, palpable distal pulses Lungs: CTAB, equal chest rise Abdomen: Soft, NTND, no rebound or guarding Gu: Edematous scrotum with indurated right testicle and spermatic cord.  Induration tracks up the right inguinal canal.  Scant, purulent output from right perc drain.  Ext: NT, No erythema  Lab Results: Recent Labs    07/27/20 0612 07/28/20 0557 07/29/20 0535  HGB 10.9* 10.7* 11.2*  HCT 34.1* 34.1* 33.9*   BMET Recent Labs    07/28/20 0557 07/29/20 0535  NA 129* 132*  K 3.3* 3.3*  CL 94* 96*  CO2 22 25  GLUCOSE 314* 251*  BUN 12 12  CREATININE 0.69 0.56*  CALCIUM 8.0* 8.1*     Studies/Results: CT ABDOMEN PELVIS W WO CONTRAST  Result Date: 07/27/2020 CLINICAL DATA:  Perinephric abscess. EXAM: CT ABDOMEN AND PELVIS WITHOUT AND WITH CONTRAST TECHNIQUE: Multidetector CT imaging of the abdomen and pelvis was performed following the standard protocol before and following the bolus administration of intravenous contrast. The patient was scanned prone due to subsequent interventional procedure. CONTRAST:  188mL OMNIPAQUE IOHEXOL 300 MG/ML   SOLN COMPARISON:  the previous day's study FINDINGS: Lower chest: No acute abnormality. Hepatobiliary: Persistent ill-defined low-attenuation region posteriorly in hepatic segment 4 B suggesting focal fatty infiltration, approximately 3 x 2 cm. No other liver lesion or biliary ductal dilatation. Gallbladder unremarkable. Pancreas: Unremarkable. No pancreatic ductal dilatation or surrounding inflammatory changes. Spleen: Normal in size without focal abnormality. Adrenals/Urinary Tract: Normal adrenals. 4.7 cm simple cyst, exophytic from upper pole left kidney. Scattered peripheral wedge-shaped hyperdense regions in both kidneys on the precontrast study, more numerous right than left, possibly related to previous day's contrast administration. There is a wedge-shaped area of hypoperfusion in the lower pole right kidney contiguous with the perinephric and posterior retroperitoneal complex process. The process shows peripheral enhancement and loculated central low attenuation, abuts the psoas, and extends inferiorly through the retroperitoneum into the pelvis as before. No other perfusion defects in the kidneys are identified. There is no hydronephrosis. No urolithiasis. Urinary bladder physiologically distended. Stomach/Bowel: Stomach decompressed. Small bowel is nondistended. Normal appendix. Colon is nondilated, unremarkable. Vascular/Lymphatic: Mild atheromatous plaque in the infrarenal aorta. No aneurysm, dissection, or stenosis. Reproductive: Prostate is unremarkable. Other: No ascites.  No free air. Musculoskeletal: No acute or significant osseous findings. IMPRESSION: 1. No significant change in wedge-shaped area of hypoperfusion in the lower pole right kidney contiguous with the perinephric and posterior retroperitoneal complex process. Favor infectious/inflammatory process over neoplasm. Follow-up recommended to confirm appropriate resolution. 2. Scattered wedge-shaped hyperdense regions in both kidneys on the  precontrast study, more numerous right than left, possibly related to previous day's contrast administration. Correlate with any clinical or laboratory evidence of pyelonephritis. 3. Persistent  ill-defined low-attenuation region posteriorly in hepatic segment 4 B, suggesting focal fatty infiltration. Consider elective outpatient liver MR with contrast for more definitive characterization. Aortic Atherosclerosis (ICD10-I70.0). Electronically Signed   By: Lucrezia Europe M.D.   On: 07/27/2020 13:00   US SCROTUM W/DOPPLER  Result Date: 07/28/2020 CLINICAL DATA:  Scrotal sac enlargement EXAM: SCROTAL ULTRASOUND DOPPLER ULTRASOUND OF THE TESTICLES TECHNIQUE: Complete ultrasound examination of the testicles, epididymis, and other scrotal structures was performed. Color and spectral Doppler ultrasound were also utilized to evaluate blood flow to the testicles. COMPARISON:  CT abdomen pelvis 07/27/2020 FINDINGS: Right testicle Measurements: 4.8 x 2.9 x 3.3 cm. No mass or microlithiasis visualized. Left testicle Measurements: 4.8 x 2.0 x 3.0 cm. No mass or microlithiasis visualized. Right epididymis:  Normal in size and appearance. Left epididymis:  Normal in size and appearance. Hydrocele:  Small bilateral hydroceles. Varicocele:  Bilateral varicoceles visualized. Pulsed Doppler interrogation of both testes demonstrates normal low resistance arterial and venous waveforms bilaterally. Bilateral scrotal wall thickening. There is complex fluid distending within the right inguinal canal with low level internal echoes extending from the region of the pelvis to the scrotal sac maximal dimensions measuring 2.6 x 3.6 cm trans axially and extending up to approximately 19 cm in length. IMPRESSION: 1. Negative for testicular torsion or intratesticular mass. 2. Bilateral scrotal wall thickening. No evidence of scrotal wall fluid collection or abscess. 3. There is complex fluid tracking within the right inguinal canal extending from the  pelvis to the right hemiscrotum, similar in appearance to the previous CT abdomen pelvis 07/27/2020. 4. Small bilateral hydroceles. 5. Bilateral varicoceles. Electronically Signed   By: Davina Poke D.O.   On: 07/28/2020 15:24   CT IMAGE GUIDED DRAINAGE BY PERCUTANEOUS CATHETER  Result Date: 07/27/2020 CLINICAL DATA:  Retroperitoneal abscess EXAM: CT GUIDED DRAINAGE OF RETROPERITONEAL ABSCESS ANESTHESIA/SEDATION: Intravenous Fentanyl 29mcg and Versed 1.5mg  were administered as conscious sedation during continuous monitoring of the patient's level of consciousness and physiological / cardiorespiratory status by the radiology RN, with a total moderate sedation time of 14 minutes. PROCEDURE: The procedure, risks, benefits, and alternatives were explained to the patient. Questions regarding the procedure were encouraged and answered. The patient understands and consents to the procedure. Patient had previously been placed prone for diagnostic CT, reported separately. The collection was localized and an appropriate skin entry site was determined and marked. The operative field was prepped with chlorhexidinein a sterile fashion, and a sterile drape was applied covering the operative field. A sterile gown and sterile gloves were used for the procedure. Local anesthesia was provided with 1% Lidocaine. Under CT fluoroscopic guidance, 18 gauge trocar needle advanced in the collection. Purulent material could be aspirated. Amplatz wire advanced easily within the collection, confirmed on CT fluoro. Tract dilated to facilitate placement of a 12 French pigtail drain catheter, formed centrally within the collection. 30 mL of purulent material were aspirated, sent for Gram stain and culture. Catheter secured externally with 0 Prolene suture and StatLock and placed to gravity drain bag. The patient tolerated the procedure well. COMPLICATIONS: None immediate FINDINGS: Complex loculated right retroperitoneal fluid collection  was localized. 12 French pigtail drain catheter placed as above. 30 mL purulent aspirate sent for Gram stain and culture. IMPRESSION: Technically successful CT-guided right retroperitoneal abscess drain catheter placement. Electronically Signed   By: Lucrezia Europe M.D.   On: 07/27/2020 13:02    Assessment/Plan: 64 year old male with a large right retroperitoneal/perinephric abscess status post percutaneous drain placement on 07/27/2020 now  with worsening right testicular pain, persistent leukocytosis and markedly elevated blood glucose  -d/c zosyn and start IV levaquin.  Final cultures pending -Repeat CT pending -Blood glucose needs to get corrected as it is likely the initial source of his infection -PVR -Will continue to monitor   LOS: 3 days   Ellison Hughs, MD [facility] Pager: [phone]  07/29/2020, 8:08 AM

## 2020-07-29 NOTE — Progress Notes (Signed)
PROGRESS NOTE    Paul Estes  MWU:[id number] DOB: [birth year] DOA: 07/26/2020 PCP: [facility]   Chief Complain: Abdominal pain  Brief Narrative:  Patient is a 64 year old male with no significant past medical history with who was recently placed on steroids for low back pain/sciatica who presented with complaints of abdominal pain on the right side.  He  also felt that there is some swelling on the right lower quadrant.  On presentation he was febrile, had leukocytosis, elevated blood glucose.  CT abdomen/pelvis was concerning for enhancing collection in the right pararenal space concerning for retroperitoneal abscess.  Urology/IR consulted.  Started on empiric antibiotics.  He underwent IR guided retroperitoneal abscess drain catheteron 10/28/19.  Wound culture, urine cultures showing MSSA.Marland Kitchen    Assessment & Plan:   Principal Problem:   Abnormal CT of the abdomen Active Problems:   Flank pain   Retroperitoneal abscess involving the right pararenal space/renal abscess:There was also suspicion for  renal mass but given his significant leukocytosis, fever, abscess was  favored. Urology consulted and following.  IR also consulted he underwent retroperitoneal abscess drain catheter on 07/27/2020 and there was frank drainage of abscess of the drain.  Urine culture, wound culture showing MSSA.  Most likely the source.  Will change antibiotics to Unasyn for now.  Blood cultures negative so far.Currently he is afebrile but has severe leukocytosis. His abdominal pain, back pain has improved today. CT showed decease in size of the last abscess, but showed another complex fluid collection is seen in the lateral portion of the right retroperitoneal region which extends down to the right lower quadrant and right inguinal region. Fluid was seen extending into the visualized portion of the upper right scrotum. I have notified IR about this finding  Hyperglycemia: Has  severe undiagnosed/uncontrolled diabetes mellitus.  Hemoglobin A1c in the range of 14 .  Continue current insulin regimen.  Monitor blood sugars.  Diabetic coordinator consulted.  He needs insulin on discharge.  Scrotal edema: new problem.  Ultrasound of the scrotum did not show testicular torsion/abscess.  This is secondary to extravasation of retroperitoneal collection/abscess into the right scrotum through inguinal canal.  Iron deficiency normocytic anemia: Iron panel showed iron deficiency.  Given a 1 dose of IV iron  Low back pain: Was on steroids for sciatica at home.  Complains of severe back pain with right lower extremity numbness/tingling.  Continue pain medications,muscle  relaxants  Hypokalemia: Being supplemented with potassium  Hyponatremia: Sodium of 132.  We will continue to monitor.      Nutrition Problem: Increased nutrient needs Etiology:  (retroperitonael abscess)      DVT prophylaxis:SCD Code Status: Full Family Communication: None at bed side Status is: Inpatient  Remains inpatient appropriate because:Inpatient level of care appropriate due to severity of illness   Dispo: The patient is from: Home              Anticipated d/c is to: Home              Anticipated d/c date is:1-2 days              Patient currently is not medically stable to d/c.  Still has severe leukocytosis   Consultants: Urology,IR  Procedures:None  Antimicrobials:  Anti-infectives (From admission, onward)   Start     Dose/Rate Route Frequency Ordered Stop   07/29/20 0900  levofloxacin (LEVAQUIN) IVPB 750 mg        750 mg 100 mL/hr over 90  Minutes Intravenous Every 24 hours 07/29/20 0748     07/28/20 2200  vancomycin (VANCOCIN) IVPB 1000 mg/200 mL premix        1,000 mg 200 mL/hr over 60 Minutes Intravenous Every 12 hours 07/28/20 0950     07/28/20 1045  vancomycin (VANCOREADY) IVPB 2000 mg/400 mL        2,000 mg 200 mL/hr over 120 Minutes Intravenous STAT 07/28/20 0946  07/28/20 1220   07/27/20 0600  piperacillin-tazobactam (ZOSYN) IVPB 3.375 g  Status:  Discontinued        3.375 g 12.5 mL/hr over 240 Minutes Intravenous Every 8 hours 07/27/20 0506 07/29/20 0748   07/26/20 1345  cefTRIAXone (ROCEPHIN) 2 g in sodium chloride 0.9 % 100 mL IVPB       "And" Linked Group Details   2 g 200 mL/hr over 30 Minutes Intravenous  Once 07/26/20 1336 07/26/20 1444   07/26/20 1345  metroNIDAZOLE (FLAGYL) IVPB 500 mg       "And" Linked Group Details   500 mg 100 mL/hr over 60 Minutes Intravenous  Once 07/26/20 1336 07/26/20 1653      Subjective: Patient seen and examined at bedside this morning.  Sitting on the chair.  He looks more comfortable today and he says his pain is better than yesterday.  No significant change in the scrotal edema.  Objective: Vitals:   07/28/20 0613 07/28/20 1423 07/28/20 2021 07/29/20 0628  BP: (!) 164/89 (!) 145/83 (!) 159/90 (!) 147/88  Pulse: 91 90 92 90  Resp: 18 18    Temp: 99.4 F (37.4 C) 98 F (36.7 C) (!) 100.7 F (38.2 C) 99.3 F (37.4 C)  TempSrc: Oral Oral Oral Oral  SpO2: 90% 90% 92% 91%  Weight:      Height:        Intake/Output Summary (Last 24 hours) at 07/29/2020 0842 Last data filed at 07/29/2020 0800 Gross per 24 hour  Intake 2000.02 ml  Output 2425 ml  Net -424.98 ml   Filed Weights   07/26/20 1204 07/27/20 0229  Weight: 106.1 kg 105.1 kg    Examination: General exam:comfortable Respiratory system: Bilateral equal air entry, normal vesicular breath sounds, no wheezes or crackles  Cardiovascular system: S1 & S2 heard, RRR. No JVD, murmurs, rubs, gallops or clicks. Gastrointestinal system: Abdomen is nondistended, soft and nontender. No organomegaly or masses felt. Normal bowel sounds heard.drain on the right back,abscess collection on bag Central nervous system: Alert and oriented. No focal neurological deficits. Extremities: No edema, no clubbing ,no cyanosis Skin: No rashes, lesions or ulcers,no  icterus ,no pallor GU: Mild scrotal swelling, thickened right  inguinal cord   Data Reviewed: I have personally reviewed following labs and imaging studies  CBC: Recent Labs  Lab 07/26/20 1243 07/27/20 0612 07/28/20 0557 07/29/20 0535  WBC 23.2* 24.3* 23.2* 22.2*  NEUTROABS 20.9* 22.0* 20.7* 19.6*  HGB 10.6* 10.9* 10.7* 11.2*  HCT 32.7* 34.1* 34.1* 33.9*  MCV 84.5 88.1 87.9 84.8  PLT 442* 474* 484* 628*   Basic Metabolic Panel: Recent Labs  Lab 07/26/20 1243 07/27/20 0612 07/28/20 0557 07/29/20 0535  NA 130* 135 129* 132*  K 3.8 3.7 3.3* 3.3*  CL 93* 101 94* 96*  CO2 22 18* 22 25  GLUCOSE 386* 307* 314* 251*  BUN 16 15 12 12   CREATININE 0.71 0.79 0.69 0.56*  CALCIUM 8.4* 8.3* 8.0* 8.1*  MG  --   --  1.8  --    GFR: Estimated Creatinine Clearance:  116.9 mL/min (A) (by C-G formula based on SCr of 0.56 mg/dL (L)). Liver Function Tests: Recent Labs  Lab 07/26/20 1243 07/27/20 0612  AST 12* 9*  ALT 13 11  ALKPHOS 122 127*  BILITOT 0.7 1.4*  PROT 7.2 7.0  ALBUMIN 2.3* 2.4*   Recent Labs  Lab 07/26/20 1243  LIPASE 18   No results for input(s): AMMONIA in the last 168 hours. Coagulation Profile: Recent Labs  Lab 07/26/20 1701 07/27/20 0612  INR 1.3* 1.3*   Cardiac Enzymes: No results for input(s): CKTOTAL, CKMB, CKMBINDEX, TROPONINI in the last 168 hours. BNP (last 3 results) No results for input(s): PROBNP in the last 8760 hours. HbA1C: No results for input(s): HGBA1C in the last 72 hours. CBG: Recent Labs  Lab 07/28/20 2022 07/29/20 0236 07/29/20 0237 07/29/20 0625 07/29/20 0732  GLUCAP 264* 291* 293* 235* 258*   Lipid Profile: No results for input(s): CHOL, HDL, LDLCALC, TRIG, CHOLHDL, LDLDIRECT in the last 72 hours. Thyroid Function Tests: No results for input(s): TSH, T4TOTAL, FREET4, T3FREE, THYROIDAB in the last 72 hours. Anemia Panel: Recent Labs    07/27/20 0612  VITAMINB12 524  FOLATE 15.7  FERRITIN 775*  TIBC 175*  IRON  23*  RETICCTPCT 1.9   Sepsis Labs: Recent Labs  Lab 07/26/20 1701  LATICACIDVEN 1.0    Recent Results (from the past 240 hour(s))  Urine culture     Status: Abnormal   Collection Time: 07/26/20 12:44 PM   Specimen: Urine, Random  Result Value Ref Range Status   Specimen Description   Final    URINE, RANDOM Performed at [facility], [address] [place], [place] [address]    Special Requests   Final    NONE Performed at [facility], [address] [place], [place] [address]    Culture 60,000 COLONIES/mL STAPHYLOCOCCUS AUREUS (A)  Final   Report Status 07/29/2020 FINAL  Final   Organism ID, Bacteria STAPHYLOCOCCUS AUREUS (A)  Final      Susceptibility   Staphylococcus aureus - MIC*    CIPROFLOXACIN >=8 RESISTANT Resistant     GENTAMICIN <=0.5 SENSITIVE Sensitive     NITROFURANTOIN <=16 SENSITIVE Sensitive     OXACILLIN <=0.25 SENSITIVE Sensitive     TETRACYCLINE <=1 SENSITIVE Sensitive     VANCOMYCIN 1 SENSITIVE Sensitive     TRIMETH/SULFA <=10 SENSITIVE Sensitive     CLINDAMYCIN <=0.25 SENSITIVE Sensitive     RIFAMPIN <=0.5 SENSITIVE Sensitive     Inducible Clindamycin NEGATIVE Sensitive     * 60,000 COLONIES/mL STAPHYLOCOCCUS AUREUS  Respiratory Panel by RT PCR (Flu A&B, Covid) - Nasopharyngeal Swab     Status: None   Collection Time: 07/26/20  4:45 PM   Specimen: Nasopharyngeal Swab  Result Value Ref Range Status   SARS Coronavirus 2 by RT PCR NEGATIVE NEGATIVE Final    Comment: (NOTE) SARS-CoV-2 target nucleic acids are NOT DETECTED.  The SARS-CoV-2 RNA is generally detectable in upper respiratoy specimens during the acute phase of infection. The lowest concentration of SARS-CoV-2 viral copies this assay can detect is 131 copies/mL. A negative result does not preclude SARS-Cov-2 infection and should not be used as the sole basis for treatment or other patient management decisions. A negative result may occur with  improper specimen  collection/handling, submission of specimen other than nasopharyngeal swab, presence of viral mutation(s) within the areas targeted by this assay, and inadequate number of viral copies (<131 copies/mL). A negative result must  be combined with clinical observations, patient history, and epidemiological information. The expected result is Negative.  Fact Sheet for Patients:  PinkCheek.be  Fact Sheet for Healthcare Providers:  GravelBags.it  This test is no t yet approved or cleared by the [place] FDA and  has been authorized for detection and/or diagnosis of SARS-CoV-2 by FDA under an Emergency Use Authorization (EUA). This EUA will remain  in effect (meaning this test can be used) for the duration of the COVID-19 declaration under Section 564(b)(1) of the Act, 21 U.S.C. section 360bbb-3(b)(1), unless the authorization is terminated or revoked sooner.     Influenza A by PCR NEGATIVE NEGATIVE Final   Influenza B by PCR NEGATIVE NEGATIVE Final    Comment: (NOTE) The Xpert Xpress SARS-CoV-2/FLU/RSV assay is intended as an aid in  the diagnosis of influenza from Nasopharyngeal swab specimens and  should not be used as a sole basis for treatment. Nasal washings and  aspirates are unacceptable for Xpert Xpress SARS-CoV-2/FLU/RSV  testing.  Fact Sheet for Patients: PinkCheek.be  Fact Sheet for Healthcare Providers: GravelBags.it  This test is not yet approved or cleared by the [place] FDA and  has been authorized for detection and/or diagnosis of SARS-CoV-2 by  FDA under an Emergency Use Authorization (EUA). This EUA will remain  in effect (meaning this test can be used) for the duration of the  Covid-19 declaration under Section 564(b)(1) of the Act, 21  U.S.C. section 360bbb-3(b)(1), unless the authorization is  terminated or revoked. Performed at [facility], [address] [place], [place] [address]   Culture, blood (routine x 2)     Status: None (Preliminary result)   Collection Time: 07/26/20  5:55 PM   Specimen: BLOOD RIGHT HAND  Result Value Ref Range Status   Specimen Description   Final    BLOOD RIGHT HAND Performed at [facility], [address] [place], [place] [address]    Special Requests   Final    BOTTLES DRAWN AEROBIC AND ANAEROBIC Blood Culture adequate volume Performed at [facility], [address] [place], [place] [address]    Culture   Final    NO GROWTH 1 DAY Performed at [facility], [place] [address], [place], [place] [address]    Report Status PENDING  Incomplete  Culture, blood (routine x 2)     Status: None (Preliminary result)   Collection Time: 07/26/20  6:10 PM   Specimen: BLOOD  Result Value Ref Range Status   Specimen Description   Final    BLOOD LEFT ANTECUBITAL Performed at [facility], [place] [address], [place], [place] [address]    Special Requests   Final    BOTTLES DRAWN AEROBIC AND ANAEROBIC Blood Culture adequate volume Performed at [facility], [address] [place], [place] [address]    Culture   Final    NO GROWTH 1 DAY Performed at [facility], [place] [address] [place], [place] [address]    Report Status PENDING  Incomplete  Aerobic/Anaerobic Culture (surgical/deep wound)     Status: None (Preliminary result)   Collection Time: 07/27/20 10:42 AM   Specimen: Abscess  Result Value Ref Range Status   Specimen Description   Final    ABSCESS Performed at [place] [address], [place], [place] [address]    Special Requests   Final    NONE Performed at  [facility], [place] [address] [place], [place] [address]    Gram Stain   Final    ABUNDANT WBC PRESENT, PREDOMINANTLY PMN ABUNDANT GRAM POSITIVE COCCI IN PAIRS IN CLUSTERS Performed at [facility], [place] [address] [place], [place] [address]    Culture ABUNDANT STAPHYLOCOCCUS AUREUS  Final   Report Status PENDING  Incomplete         Radiology Studies: CT ABDOMEN PELVIS W WO CONTRAST  Result Date: 07/27/2020 CLINICAL DATA:  Perinephric abscess. EXAM: CT ABDOMEN AND PELVIS WITHOUT AND WITH CONTRAST TECHNIQUE: Multidetector CT imaging of the abdomen and pelvis was performed following the standard protocol before and following the bolus administration of intravenous contrast. The patient was scanned prone due to subsequent interventional procedure. CONTRAST:  190mL OMNIPAQUE IOHEXOL 300 MG/ML  SOLN COMPARISON:  the previous day's study FINDINGS: Lower chest: No acute abnormality. Hepatobiliary: Persistent ill-defined low-attenuation region posteriorly in hepatic segment 4 B suggesting focal fatty infiltration, approximately 3 x 2 cm. No other liver lesion or biliary ductal dilatation. Gallbladder unremarkable. Pancreas: Unremarkable. No pancreatic ductal dilatation or surrounding inflammatory changes. Spleen: Normal in size without focal abnormality. Adrenals/Urinary Tract: Normal adrenals. 4.7 cm simple cyst, exophytic from upper pole left kidney. Scattered peripheral wedge-shaped hyperdense regions in both kidneys on the precontrast study, more numerous right than left, possibly related to previous day's contrast administration. There is a wedge-shaped area of hypoperfusion in the lower pole right kidney contiguous with the perinephric and posterior retroperitoneal complex process. The process shows peripheral enhancement and loculated central low attenuation, abuts the psoas, and extends inferiorly through the retroperitoneum into the pelvis as before. No other perfusion defects in the kidneys are identified. There is no hydronephrosis. No urolithiasis. Urinary bladder physiologically distended. Stomach/Bowel: Stomach decompressed. Small bowel is nondistended. Normal appendix. Colon is nondilated,  unremarkable. Vascular/Lymphatic: Mild atheromatous plaque in the infrarenal aorta. No aneurysm, dissection, or stenosis. Reproductive: Prostate is unremarkable. Other: No ascites.  No free air. Musculoskeletal: No acute or significant osseous findings. IMPRESSION: 1. No significant change in wedge-shaped area of hypoperfusion in the lower pole right kidney contiguous with the perinephric and posterior retroperitoneal complex process. Favor infectious/inflammatory process over neoplasm. Follow-up recommended to confirm appropriate resolution. 2. Scattered wedge-shaped hyperdense regions in both kidneys on the precontrast study, more numerous right than left, possibly related to previous day's contrast administration. Correlate with any clinical or laboratory evidence of pyelonephritis. 3. Persistent ill-defined low-attenuation region posteriorly in hepatic segment 4 B, suggesting focal fatty infiltration. Consider elective outpatient liver MR with contrast for more definitive characterization. Aortic Atherosclerosis (ICD10-I70.0). Electronically Signed   By: Lucrezia Europe M.D.   On: 07/27/2020 13:00   US SCROTUM W/DOPPLER  Result Date: 07/28/2020 CLINICAL DATA:  Scrotal sac enlargement EXAM: SCROTAL ULTRASOUND DOPPLER ULTRASOUND OF THE TESTICLES TECHNIQUE: Complete ultrasound examination of the testicles, epididymis, and other scrotal structures was performed. Color and spectral Doppler ultrasound were also utilized to evaluate blood flow to the testicles. COMPARISON:  CT abdomen pelvis 07/27/2020 FINDINGS: Right testicle Measurements: 4.8 x 2.9 x 3.3 cm. No mass or microlithiasis visualized. Left testicle Measurements: 4.8 x 2.0 x 3.0 cm. No mass or microlithiasis visualized. Right epididymis:  Normal in size and appearance. Left epididymis:  Normal in size and appearance. Hydrocele:  Small bilateral hydroceles. Varicocele:  Bilateral varicoceles visualized. Pulsed Doppler interrogation of both testes  demonstrates normal low resistance arterial and venous waveforms bilaterally. Bilateral scrotal wall thickening. There is complex fluid distending within  the right inguinal canal with low level internal echoes extending from the region of the pelvis to the scrotal sac maximal dimensions measuring 2.6 x 3.6 cm trans axially and extending up to approximately 19 cm in length. IMPRESSION: 1. Negative for testicular torsion or intratesticular mass. 2. Bilateral scrotal wall thickening. No evidence of scrotal wall fluid collection or abscess. 3. There is complex fluid tracking within the right inguinal canal extending from the pelvis to the right hemiscrotum, similar in appearance to the previous CT abdomen pelvis 07/27/2020. 4. Small bilateral hydroceles. 5. Bilateral varicoceles. Electronically Signed   By: Davina Poke D.O.   On: 07/28/2020 15:24   CT IMAGE GUIDED DRAINAGE BY PERCUTANEOUS CATHETER  Result Date: 07/27/2020 CLINICAL DATA:  Retroperitoneal abscess EXAM: CT GUIDED DRAINAGE OF RETROPERITONEAL ABSCESS ANESTHESIA/SEDATION: Intravenous Fentanyl 70mcg and Versed 1.5mg  were administered as conscious sedation during continuous monitoring of the patient's level of consciousness and physiological / cardiorespiratory status by the radiology RN, with a total moderate sedation time of 14 minutes. PROCEDURE: The procedure, risks, benefits, and alternatives were explained to the patient. Questions regarding the procedure were encouraged and answered. The patient understands and consents to the procedure. Patient had previously been placed prone for diagnostic CT, reported separately. The collection was localized and an appropriate skin entry site was determined and marked. The operative field was prepped with chlorhexidinein a sterile fashion, and a sterile drape was applied covering the operative field. A sterile gown and sterile gloves were used for the procedure. Local anesthesia was provided with 1%  Lidocaine. Under CT fluoroscopic guidance, 18 gauge trocar needle advanced in the collection. Purulent material could be aspirated. Amplatz wire advanced easily within the collection, confirmed on CT fluoro. Tract dilated to facilitate placement of a 12 French pigtail drain catheter, formed centrally within the collection. 30 mL of purulent material were aspirated, sent for Gram stain and culture. Catheter secured externally with 0 Prolene suture and StatLock and placed to gravity drain bag. The patient tolerated the procedure well. COMPLICATIONS: None immediate FINDINGS: Complex loculated right retroperitoneal fluid collection was localized. 12 French pigtail drain catheter placed as above. 30 mL purulent aspirate sent for Gram stain and culture. IMPRESSION: Technically successful CT-guided right retroperitoneal abscess drain catheter placement. Electronically Signed   By: Lucrezia Europe M.D.   On: 07/27/2020 13:02        Scheduled Meds: . insulin aspart  0-9 Units Subcutaneous Q4H  . insulin aspart  5 Units Subcutaneous TID WC  . insulin glargine  40 Units Subcutaneous Daily  . potassium chloride  40 mEq Oral Once  . Ensure Max Protein  11 oz Oral BID  . sodium chloride flush  5 mL Intracatheter Q8H   Continuous Infusions: . levofloxacin (LEVAQUIN) IV    . methocarbamol (ROBAXIN) IV 500 mg (07/29/20 6381)  . vancomycin 1,000 mg (07/28/20 2155)     LOS: 3 days    Time spent: 35 mins.More than 50% of that time was spent in counseling and/or coordination of care.      Shelly Coss, MD [facility] P10/25/2021, 8:42 AM

## 2020-07-29 NOTE — Progress Notes (Signed)
Supervising Physician: Dr. Serafina Royals  Patient Status: [facility] - In-pt  Subjective: S/p CT guided perc drain to right RP abscess on 10/23. Pt c/o more right groin and scrotal pain/swelling Urology has ordered new CT this am.  Objective: Physical Exam: BP (!) 147/88 (BP Location: Right Arm)   Pulse 90   Temp 99.3 F (37.4 C) (Oral)   Resp 18   Ht 6' (1.829 m)   Wt 105.1 kg   SpO2 91%   BMI 31.42 kg/m  (R)post RP drain intact, site clean. Purulent output   Current Facility-Administered Medications:  .  acetaminophen (TYLENOL) tablet 650 mg, 650 mg, Oral, Q6H PRN, 650 mg at 07/29/20 0520 **OR** acetaminophen (TYLENOL) suppository 650 mg, 650 mg, Rectal, Q6H PRN, Rise Patience, MD .  HYDROmorphone (DILAUDID) injection 0.5 mg, 0.5 mg, Intravenous, Q2H PRN, Rise Patience, MD, 0.5 mg at 07/28/20 1143 .  ibuprofen (ADVIL) tablet 600 mg, 600 mg, Oral, Q6H PRN, Adhikari, Amrit, MD .  insulin aspart (novoLOG) injection 0-9 Units, 0-9 Units, Subcutaneous, Q4H, Rise Patience, MD, 3 Units at 07/29/20 [phone] .  insulin aspart (novoLOG) injection 5 Units, 5 Units, Subcutaneous, TID WC, Shelly Coss, MD, 5 Units at 07/29/20 0814 .  insulin glargine (LANTUS) injection 40 Units, 40 Units, Subcutaneous, Daily, Adhikari, Amrit, MD .  levofloxacin (LEVAQUIN) IVPB 750 mg, 750 mg, Intravenous, Q24H, Winter, Conception Oms, MD .  methocarbamol (ROBAXIN) 500 mg in dextrose 5 % 50 mL IVPB, 500 mg, Intravenous, Q6H PRN, Shelly Coss, MD, Last Rate: 100 mL/hr at 07/29/20 0621, 500 mg at 07/29/20 0621 .  metroNIDAZOLE (FLAGYL) IVPB 500 mg, 500 mg, Intravenous, Q8H, Adhikari, Amrit, MD .  oxyCODONE (Oxy IR/ROXICODONE) immediate release tablet 10 mg, 10 mg, Oral, Q6H PRN, Shelly Coss, MD, 10 mg at 07/29/20 0520 .  potassium chloride SA (KLOR-CON) CR tablet 40 mEq, 40 mEq, Oral, Once, Adhikari, Amrit, MD .  protein supplement (ENSURE MAX) liquid, 11 oz, Oral, BID, Adhikari, Amrit,  MD, 11 oz at 07/28/20 2313 .  sodium chloride flush (NS) 0.9 % injection 5 mL, 5 mL, Intracatheter, Q8H, Arne Cleveland, MD, 5 mL at 07/29/20 0550 .  vancomycin (VANCOCIN) IVPB 1000 mg/200 mL premix, 1,000 mg, Intravenous, Q12H, Luiz Ochoa, RPH, Last Rate: 200 mL/hr at 07/28/20 2155, 1,000 mg at 07/28/20 2155  Labs: CBC Recent Labs    07/28/20 0557 07/29/20 0535  WBC 23.2* 22.2*  HGB 10.7* 11.2*  HCT 34.1* 33.9*  PLT 484* 459*   BMET Recent Labs    07/28/20 0557 07/29/20 0535  NA 129* 132*  K 3.3* 3.3*  CL 94* 96*  CO2 22 25  GLUCOSE 314* 251*  BUN 12 12  CREATININE 0.69 0.56*  CALCIUM 8.0* 8.1*   LFT Recent Labs    07/26/20 1243 07/26/20 1243 07/27/20 0612  PROT 7.2   < > 7.0  ALBUMIN 2.3*   < > 2.4*  AST 12*   < > 9*  ALT 13   < > 11  ALKPHOS 122   < > 127*  BILITOT 0.7   < > 1.4*  BILIDIR  --   --  0.1  IBILI  --   --  1.3*  LIPASE 18  --   --    < > = values in this interval not displayed.   PT/INR Recent Labs    07/26/20 1701 07/27/20 0612  LABPROT 15.3* 15.8*  INR 1.3* 1.3*     Studies/Results: CT  ABDOMEN PELVIS W WO CONTRAST  Result Date: 07/27/2020 CLINICAL DATA:  Perinephric abscess. EXAM: CT ABDOMEN AND PELVIS WITHOUT AND WITH CONTRAST TECHNIQUE: Multidetector CT imaging of the abdomen and pelvis was performed following the standard protocol before and following the bolus administration of intravenous contrast. The patient was scanned prone due to subsequent interventional procedure. CONTRAST:  138mL OMNIPAQUE IOHEXOL 300 MG/ML  SOLN COMPARISON:  the previous day's study FINDINGS: Lower chest: No acute abnormality. Hepatobiliary: Persistent ill-defined low-attenuation region posteriorly in hepatic segment 4 B suggesting focal fatty infiltration, approximately 3 x 2 cm. No other liver lesion or biliary ductal dilatation. Gallbladder unremarkable. Pancreas: Unremarkable. No pancreatic ductal dilatation or surrounding inflammatory changes.  Spleen: Normal in size without focal abnormality. Adrenals/Urinary Tract: Normal adrenals. 4.7 cm simple cyst, exophytic from upper pole left kidney. Scattered peripheral wedge-shaped hyperdense regions in both kidneys on the precontrast study, more numerous right than left, possibly related to previous day's contrast administration. There is a wedge-shaped area of hypoperfusion in the lower pole right kidney contiguous with the perinephric and posterior retroperitoneal complex process. The process shows peripheral enhancement and loculated central low attenuation, abuts the psoas, and extends inferiorly through the retroperitoneum into the pelvis as before. No other perfusion defects in the kidneys are identified. There is no hydronephrosis. No urolithiasis. Urinary bladder physiologically distended. Stomach/Bowel: Stomach decompressed. Small bowel is nondistended. Normal appendix. Colon is nondilated, unremarkable. Vascular/Lymphatic: Mild atheromatous plaque in the infrarenal aorta. No aneurysm, dissection, or stenosis. Reproductive: Prostate is unremarkable. Other: No ascites.  No free air. Musculoskeletal: No acute or significant osseous findings. IMPRESSION: 1. No significant change in wedge-shaped area of hypoperfusion in the lower pole right kidney contiguous with the perinephric and posterior retroperitoneal complex process. Favor infectious/inflammatory process over neoplasm. Follow-up recommended to confirm appropriate resolution. 2. Scattered wedge-shaped hyperdense regions in both kidneys on the precontrast study, more numerous right than left, possibly related to previous day's contrast administration. Correlate with any clinical or laboratory evidence of pyelonephritis. 3. Persistent ill-defined low-attenuation region posteriorly in hepatic segment 4 B, suggesting focal fatty infiltration. Consider elective outpatient liver MR with contrast for more definitive characterization. Aortic Atherosclerosis  (ICD10-I70.0). Electronically Signed   By: Lucrezia Europe M.D.   On: 07/27/2020 13:00   US SCROTUM W/DOPPLER  Result Date: 07/28/2020 CLINICAL DATA:  Scrotal sac enlargement EXAM: SCROTAL ULTRASOUND DOPPLER ULTRASOUND OF THE TESTICLES TECHNIQUE: Complete ultrasound examination of the testicles, epididymis, and other scrotal structures was performed. Color and spectral Doppler ultrasound were also utilized to evaluate blood flow to the testicles. COMPARISON:  CT abdomen pelvis 07/27/2020 FINDINGS: Right testicle Measurements: 4.8 x 2.9 x 3.3 cm. No mass or microlithiasis visualized. Left testicle Measurements: 4.8 x 2.0 x 3.0 cm. No mass or microlithiasis visualized. Right epididymis:  Normal in size and appearance. Left epididymis:  Normal in size and appearance. Hydrocele:  Small bilateral hydroceles. Varicocele:  Bilateral varicoceles visualized. Pulsed Doppler interrogation of both testes demonstrates normal low resistance arterial and venous waveforms bilaterally. Bilateral scrotal wall thickening. There is complex fluid distending within the right inguinal canal with low level internal echoes extending from the region of the pelvis to the scrotal sac maximal dimensions measuring 2.6 x 3.6 cm trans axially and extending up to approximately 19 cm in length. IMPRESSION: 1. Negative for testicular torsion or intratesticular mass. 2. Bilateral scrotal wall thickening. No evidence of scrotal wall fluid collection or abscess. 3. There is complex fluid tracking within the right inguinal canal extending from the pelvis  to the right hemiscrotum, similar in appearance to the previous CT abdomen pelvis 07/27/2020. 4. Small bilateral hydroceles. 5. Bilateral varicoceles. Electronically Signed   By: Davina Poke D.O.   On: 07/28/2020 15:24   CT IMAGE GUIDED DRAINAGE BY PERCUTANEOUS CATHETER  Result Date: 07/27/2020 CLINICAL DATA:  Retroperitoneal abscess EXAM: CT GUIDED DRAINAGE OF RETROPERITONEAL ABSCESS  ANESTHESIA/SEDATION: Intravenous Fentanyl 2mcg and Versed 1.5mg  were administered as conscious sedation during continuous monitoring of the patient's level of consciousness and physiological / cardiorespiratory status by the radiology RN, with a total moderate sedation time of 14 minutes. PROCEDURE: The procedure, risks, benefits, and alternatives were explained to the patient. Questions regarding the procedure were encouraged and answered. The patient understands and consents to the procedure. Patient had previously been placed prone for diagnostic CT, reported separately. The collection was localized and an appropriate skin entry site was determined and marked. The operative field was prepped with chlorhexidinein a sterile fashion, and a sterile drape was applied covering the operative field. A sterile gown and sterile gloves were used for the procedure. Local anesthesia was provided with 1% Lidocaine. Under CT fluoroscopic guidance, 18 gauge trocar needle advanced in the collection. Purulent material could be aspirated. Amplatz wire advanced easily within the collection, confirmed on CT fluoro. Tract dilated to facilitate placement of a 12 French pigtail drain catheter, formed centrally within the collection. 30 mL of purulent material were aspirated, sent for Gram stain and culture. Catheter secured externally with 0 Prolene suture and StatLock and placed to gravity drain bag. The patient tolerated the procedure well. COMPLICATIONS: None immediate FINDINGS: Complex loculated right retroperitoneal fluid collection was localized. 12 French pigtail drain catheter placed as above. 30 mL purulent aspirate sent for Gram stain and culture. IMPRESSION: Technically successful CT-guided right retroperitoneal abscess drain catheter placement. Electronically Signed   By: Lucrezia Europe M.D.   On: 07/27/2020 13:02    Assessment/Plan: (R)RP abscess s/p perc drain 10/23 Cx pending Increased rt groin and scrotal  pain/swelling Await follow up CT today IR following, cont drain care flushes as ordered.    LOS: 3 days   I spent a total of 15 minutes in face to face in clinical consultation, greater than 50% of which was counseling/coordinating care for (R)RP drain  Ascencion Dike PA-C 07/29/2020 9:44 AM

## 2020-07-29 NOTE — Progress Notes (Addendum)
Inpatient Diabetes Program Recommendations  AACE/ADA: New Consensus Statement on Inpatient Glycemic Control (2015)  Target Ranges:  Prepandial:   less than 140 mg/dL      Peak postprandial:   less than 180 mg/dL (1-2 hours)      Critically ill patients:  140 - 180 mg/dL   Results for Paul Estes, Paul Estes (MRN [id number]) as of 07/29/2020 08:58  Ref. Range 07/28/2020 00:11 07/28/2020 06:11 07/28/2020 08:18 07/28/2020 14:29 07/28/2020 18:19 07/28/2020 20:22  Glucose-Capillary Latest Ref Range: 70 - 99 mg/dL 288 (H) 308 (H)  7 units NOVOLOG  257 (H)  5 units NOVOLOG _0 :34am +  25 units LANTUS _1 :35am 252 (H)  5 units NOVOLOG _2 :52pm +  5 units NOVOLOG _3 :45pm 223 (H)  8 units NOVOLOG  264 (H)  5 units NOVOLOG    Results for Paul Estes, Paul Estes (MRN [id number]) as of 07/29/2020 08:58  Ref. Range 07/29/2020 02:36 07/29/2020 02:37 07/29/2020 06:25 07/29/2020 07:32  Glucose-Capillary Latest Ref Range: 70 - 99 mg/dL 291 (H) 293 (H)  5 units NOVOLOG  235 (H)  3 units NOVOLOG  258 (H)  5 units NOVOLOG    Results for Paul Estes, Paul Estes (MRN [id number]) as of 07/29/2020 12:41  Ref. Range 07/28/2020 05:57  Hemoglobin A1C Latest Ref Range: 4.8 - 5.6 % 14.3 (H)  (364 mg/dl)    Admit with: Large right retroperitoneal/perinephricabscess status post percutaneous drain placement on 07/27/2020   No History of Diabetes  Recent use of Steroids at home for Back Pain     Current Orders: Lantus 40 units Daily      Novolog 5 units TID with meals      Novolog Sensitive Correction Scale/ SSI (0-9 units) Q4 hours    MD- Note Lantus increased to 40 units Daily this AM  Please consider changing the Novolog SSI to TID AC + HS   (currently ordered Q4 hours)  Plan to see pt today to discuss hyperglycemia and new diagnosis of diabetes    Addendum 3pm--Met w/ pt today to discuss new diagnosis of Diabetes (current A1c= 14.3%).  Discussed with pt that Current A1c of 14.3% is indicative of  diabetes and that this issue has been going on for at least 3 months.  Discussed how his elevated CBGs may have led to increased risk of Abscess formation.  Pt told me he checks his CBGs on occasion at home and that they are normally around 100 but he hasn't checked in a while (friend of his gave him a CBG meter--friend is a Engineer, maintenance).  Can only count for 1 relative with diabetes (grandma--diagnosed in her 14s).  No symptoms of Hyperglycemia prior to admission.  Told me he had been having burning with urination on and off for the last few months but did not seek medical care b/c he did not have a PCP (was in between MDs).  States he now has an appt with a new PCP (Dr. Veronia Beets) the 1st week of November.  Spoke with pt about new diagnosis.  Discussed A1C results with him and explained what an A1C is, basic pathophysiology of DM Type 2, basic home care, basic diabetes diet nutrition principles, importance of checking CBGs and maintaining good CBG control to prevent long-term and short-term complications.  Reviewed signs and symptoms of hyperglycemia and hypoglycemia and how to treat hypoglycemia at home.  Also reviewed blood sugar goals and A1c goals for home.  Discussed with pt that he will likely need Insulin for home (at least in the short-term)  given his A1c is so high and given that he is requiring a large amount of insulin so far in hospital.  Explained what Lantus and Novolog insulins are and how we are doing them at this point.  Pt appreciative of all info and open to using insulin at home if needed.  RNs to provide ongoing basic DM education at bedside with this patient.  Have ordered educational booklet, insulin starter kit.  Have also placed RD consult for DM diet education for this patient.     --Will follow patient during hospitalization--  Wyn Quaker RN, MSN, CDE Diabetes Coordinator Inpatient Glycemic Control Team Team Pager: [phone] (8a-5p)     --Will follow  patient during hospitalization--  Wyn Quaker RN, MSN, CDE Diabetes Coordinator Inpatient Glycemic Control Team Team Pager: [phone] (8a-5p)

## 2020-07-30 DIAGNOSIS — R935 Abnormal findings on diagnostic imaging of other abdominal regions, including retroperitoneum: Secondary | ICD-10-CM | POA: Diagnosis not present

## 2020-07-30 LAB — CBC WITH DIFFERENTIAL/PLATELET
Abs Immature Granulocytes: 0.32 10*3/uL — ABNORMAL HIGH (ref 0.00–0.07)
Basophils Absolute: 0.1 10*3/uL (ref 0.0–0.1)
Basophils Relative: 0 %
Eosinophils Absolute: 0.1 10*3/uL (ref 0.0–0.5)
Eosinophils Relative: 1 %
HCT: 33.7 % — ABNORMAL LOW (ref 39.0–52.0)
Hemoglobin: 11 g/dL — ABNORMAL LOW (ref 13.0–17.0)
Immature Granulocytes: 2 %
Lymphocytes Relative: 8 %
Lymphs Abs: 1.3 10*3/uL (ref 0.7–4.0)
MCH: 28.1 pg (ref 26.0–34.0)
MCHC: 32.6 g/dL (ref 30.0–36.0)
MCV: 86.2 fL (ref 80.0–100.0)
Monocytes Absolute: 0.8 10*3/uL (ref 0.1–1.0)
Monocytes Relative: 5 %
Neutro Abs: 14.5 10*3/uL — ABNORMAL HIGH (ref 1.7–7.7)
Neutrophils Relative %: 84 %
Platelets: 454 10*3/uL — ABNORMAL HIGH (ref 150–400)
RBC: 3.91 MIL/uL — ABNORMAL LOW (ref 4.22–5.81)
RDW: 13.8 % (ref 11.5–15.5)
WBC: 17.1 10*3/uL — ABNORMAL HIGH (ref 4.0–10.5)
nRBC: 0 % (ref 0.0–0.2)

## 2020-07-30 LAB — GLUCOSE, CAPILLARY
Glucose-Capillary: 215 mg/dL — ABNORMAL HIGH (ref 70–99)
Glucose-Capillary: 217 mg/dL — ABNORMAL HIGH (ref 70–99)
Glucose-Capillary: 210 mg/dL — ABNORMAL HIGH (ref 70–99)
Glucose-Capillary: 211 mg/dL — ABNORMAL HIGH (ref 70–99)

## 2020-07-30 LAB — BASIC METABOLIC PANEL
Anion gap: 10 (ref 5–15)
BUN: 13 mg/dL (ref 8–23)
CO2: 25 mmol/L (ref 22–32)
Calcium: 8 mg/dL — ABNORMAL LOW (ref 8.9–10.3)
Chloride: 98 mmol/L (ref 98–111)
Creatinine, Ser: 0.54 mg/dL — ABNORMAL LOW (ref 0.61–1.24)
GFR, Estimated: >60 mL/min (ref >60)
Glucose, Bld: 220 mg/dL — ABNORMAL HIGH (ref 70–99)
Potassium: 3.4 mmol/L — ABNORMAL LOW (ref 3.5–5.1)
Sodium: 133 mmol/L — ABNORMAL LOW (ref 135–145)

## 2020-07-30 MED ORDER — POLYETHYLENE GLYCOL 3350 17 G PO PACK
17.0000 g | PACK | Freq: Every day | ORAL | Status: DC
  Administered 2020-07-30 – 2020-08-01 (×2): 17 g via ORAL
  Filled 2020-07-30 (×3): qty 1

## 2020-07-30 MED ORDER — BISACODYL 10 MG RE SUPP
10.0000 mg | Freq: Once | RECTAL | Status: AC
  Administered 2020-07-30: 10 mg via RECTAL
  Filled 2020-07-30: qty 1

## 2020-07-30 MED ORDER — POTASSIUM CHLORIDE CRYS ER 20 MEQ PO TBCR
40.0000 meq | EXTENDED_RELEASE_TABLET | Freq: Once | ORAL | Status: AC
  Administered 2020-07-30: 40 meq via ORAL
  Filled 2020-07-30: qty 2

## 2020-07-30 MED ORDER — SENNOSIDES-DOCUSATE SODIUM 8.6-50 MG PO TABS
1.0000 | ORAL_TABLET | Freq: Two times a day (BID) | ORAL | Status: DC
  Administered 2020-07-30 – 2020-08-01 (×4): 1 via ORAL
  Filled 2020-07-30 (×5): qty 1

## 2020-07-30 MED ORDER — INSULIN GLARGINE 100 UNIT/ML ~~LOC~~ SOLN
50.0000 [IU] | Freq: Every day | SUBCUTANEOUS | Status: DC
  Administered 2020-07-30 – 2020-07-31 (×2): 50 [IU] via SUBCUTANEOUS
  Filled 2020-07-30 (×3): qty 0.5

## 2020-07-30 NOTE — Progress Notes (Signed)
PROGRESS NOTE    Paul Estes  SWN:[id number] DOB: [birth year] DOA: 07/26/2020 PCP: [facility]   Chief Complain: Abdominal pain  Brief Narrative:  Patient is a 64 year old male with no significant past medical history with who was recently placed on steroids for low back pain/sciatica who presented with complaints of abdominal pain on the right side.  He  also felt that there is some swelling on the right lower quadrant.  On presentation he was febrile, had leukocytosis, elevated blood glucose.  CT abdomen/pelvis was concerning for enhancing collection in the right pararenal space concerning for retroperitoneal abscess.  Urology/IR consulted.  Started on empiric antibiotics.  He underwent IR guided retroperitoneal abscess drain catheteron 10/28/19.  Abscess culture, urine cultures showed MSSA.Marland Kitchen    Assessment & Plan:   Principal Problem:   Abnormal CT of the abdomen Active Problems:   Flank pain   Retroperitoneal abscess involving the right pararenal space/renal abscess:There was also suspicion for  renal mass but given his significant leukocytosis, fever, abscess was  favored. Urology consulted and following.  IR also consulted he underwent retroperitoneal abscess drain catheter on 07/27/2020 and there was frank drainage of abscess of the drain.  Urine culture, wound culture showing MSSA.  Most likely the source.  Will change antibiotics to Unasyn for now.  Blood cultures negative so far.Currently he is afebrile but has leukocytosis which is improving. His abdominal pain, back pain has improved . CT showed decease in size of the last abscess, but showed another complex fluid collection is seen in the lateral portion of the right retroperitoneal region which extends down to the right lower quadrant and right inguinal region. Fluid was seen extending into the visualized portion of the upper right scrotum. IR said that the fluid collections might have  connections,no need to put another drain.  Hyperglycemia/uncontrolled diabetes mellitus type 2: Has severe undiagnosed/uncontrolled diabetes mellitus.  Hemoglobin A1c in the range of 14 .  Continue current insulin regimen.  Monitor blood sugars.  Diabetic coordinator consulted.  He needs insulin on discharge.  Scrotal edema: new problem.  Ultrasound of the scrotum did not show testicular torsion/abscess.  This is secondary to extravasation of retroperitoneal collection/abscess into the right scrotum through inguinal canal.  Iron deficiency normocytic anemia: Iron panel showed iron deficiency.  Given a 1 dose of IV iron  Low back pain: Was on steroids for sciatica at home.  Complains of severe back pain with right lower extremity numbness/tingling.  Continue pain medications,muscle  Relaxants.PT consulted  Hypokalemia: Being supplemented with potassium  Hyponatremia: Sodium of 132.  We will continue to monitor.      Nutrition Problem: Increased nutrient needs Etiology:  (retroperitonael abscess)      DVT prophylaxis:SCD Code Status: Full Family Communication: None at bed side Status is: Inpatient  Remains inpatient appropriate because:Inpatient level of care appropriate due to severity of illness   Dispo: The patient is from: Home              Anticipated d/c is to: Home              Anticipated d/c date is:1-2 days              Patient currently is not medically stable to d/c.  Waiting for physical therapy evaluation, diabetic educator counseling and education on insulin.  If cleared by IR, urology, he might be discharged tomorrow on oral antibiotics.   Consultants: Urology,IR  Procedures:None  Antimicrobials:  Anti-infectives (From admission,  onward)   Start     Dose/Rate Route Frequency Ordered Stop   07/29/20 2200  Ampicillin-Sulbactam (UNASYN) 3 g in sodium chloride 0.9 % 100 mL IVPB        3 g 200 mL/hr over 30 Minutes Intravenous Every 6 hours 07/29/20 1351      07/29/20 1030  metroNIDAZOLE (FLAGYL) IVPB 500 mg  Status:  Discontinued        500 mg 100 mL/hr over 60 Minutes Intravenous Every 8 hours 07/29/20 0932 07/29/20 1351   07/29/20 0900  levofloxacin (LEVAQUIN) IVPB 750 mg  Status:  Discontinued        750 mg 100 mL/hr over 90 Minutes Intravenous Every 24 hours 07/29/20 0748 07/29/20 1351   07/28/20 2200  vancomycin (VANCOCIN) IVPB 1000 mg/200 mL premix  Status:  Discontinued        1,000 mg 200 mL/hr over 60 Minutes Intravenous Every 12 hours 07/28/20 0950 07/29/20 1351   07/28/20 1045  vancomycin (VANCOREADY) IVPB 2000 mg/400 mL        2,000 mg 200 mL/hr over 120 Minutes Intravenous STAT 07/28/20 0946 07/28/20 1220   07/27/20 0600  piperacillin-tazobactam (ZOSYN) IVPB 3.375 g  Status:  Discontinued        3.375 g 12.5 mL/hr over 240 Minutes Intravenous Every 8 hours 07/27/20 0506 07/29/20 0748   07/26/20 1345  cefTRIAXone (ROCEPHIN) 2 g in sodium chloride 0.9 % 100 mL IVPB       "And" Linked Group Details   2 g 200 mL/hr over 30 Minutes Intravenous  Once 07/26/20 1336 07/26/20 1444   07/26/20 1345  metroNIDAZOLE (FLAGYL) IVPB 500 mg       "And" Linked Group Details   500 mg 100 mL/hr over 60 Minutes Intravenous  Once 07/26/20 1336 07/26/20 1653      Subjective:  Patient seen and examined at the bedside this morning.  Hemodynamically stable during my evaluation.  Sitting on the chair.  His back pain has significantly improved today.  Denies any new complaints.  Scrotal edema has not increased  in size.  Objective: Vitals:   07/29/20 0628 07/29/20 1235 07/29/20 2118 07/30/20 0450  BP: (!) 147/88 (!) 145/82 (!) 149/73 (!) 143/82  Pulse: 90 98 86 81  Resp:  18 18 18   Temp: 99.3 F (37.4 C)  100 F (37.8 C) 98.4 F (36.9 C)  TempSrc: Oral  Oral Oral  SpO2: 91% 98% 95% 90%  Weight:      Height:        Intake/Output Summary (Last 24 hours) at 07/30/2020 0824 Last data filed at 07/30/2020 0600 Gross per 24 hour  Intake 1010 ml   Output 2750 ml  Net -1740 ml   Filed Weights   07/26/20 1204 07/27/20 0229  Weight: 106.1 kg 105.1 kg    Examination:  General exam: Appears calm and comfortable ,Not in distress,obese HEENT:PERRL,Oral mucosa moist, Ear/Nose normal on gross exam Respiratory system: Bilateral equal air entry, normal vesicular breath sounds, no wheezes or crackles  Cardiovascular system: S1 & S2 heard, RRR. No JVD, murmurs, rubs, gallops or clicks. Gastrointestinal system: Abdomen is nondistended, soft and nontender. No organomegaly or masses felt. Normal bowel sounds heard. Drain on the right back Central nervous system: Alert and oriented. No focal neurological deficits. Extremities: No edema, no clubbing ,no cyanosis Skin: Flushed face, No ulcers GU: Mild scrotal swelling   Data Reviewed: I have personally reviewed following labs and imaging studies  CBC: Recent Labs  Lab  07/26/20 1243 07/27/20 0612 07/28/20 0557 07/29/20 0535 07/30/20 0531  WBC 23.2* 24.3* 23.2* 22.2* 17.1*  NEUTROABS 20.9* 22.0* 20.7* 19.6* 14.5*  HGB 10.6* 10.9* 10.7* 11.2* 11.0*  HCT 32.7* 34.1* 34.1* 33.9* 33.7*  MCV 84.5 88.1 87.9 84.8 86.2  PLT 442* 474* 484* 459* 789*   Basic Metabolic Panel: Recent Labs  Lab 07/26/20 1243 07/27/20 0612 07/28/20 0557 07/29/20 0535 07/30/20 0531  NA 130* 135 129* 132* 133*  K 3.8 3.7 3.3* 3.3* 3.4*  CL 93* 101 94* 96* 98  CO2 22 18* 22 25 25   GLUCOSE 386* 307* 314* 251* 220*  BUN 16 15 12 12 13   CREATININE 0.71 0.79 0.69 0.56* 0.54*  CALCIUM 8.4* 8.3* 8.0* 8.1* 8.0*  MG  --   --  1.8  --   --    GFR: Estimated Creatinine Clearance: 116.9 mL/min (A) (by C-G formula based on SCr of 0.54 mg/dL (L)). Liver Function Tests: Recent Labs  Lab 07/26/20 1243 07/27/20 0612  AST 12* 9*  ALT 13 11  ALKPHOS 122 127*  BILITOT 0.7 1.4*  PROT 7.2 7.0  ALBUMIN 2.3* 2.4*   Recent Labs  Lab 07/26/20 1243  LIPASE 18   No results for input(s): AMMONIA in the last 168  hours. Coagulation Profile: Recent Labs  Lab 07/26/20 1701 07/27/20 0612  INR 1.3* 1.3*   Cardiac Enzymes: No results for input(s): CKTOTAL, CKMB, CKMBINDEX, TROPONINI in the last 168 hours. BNP (last 3 results) No results for input(s): PROBNP in the last 8760 hours. HbA1C: Recent Labs    07/28/20 0557  HGBA1C 14.3*   CBG: Recent Labs  Lab 07/29/20 0732 07/29/20 1148 07/29/20 1610 07/29/20 2120 07/30/20 0740  GLUCAP 258* 280* 249* 293* 215*   Lipid Profile: No results for input(s): CHOL, HDL, LDLCALC, TRIG, CHOLHDL, LDLDIRECT in the last 72 hours. Thyroid Function Tests: No results for input(s): TSH, T4TOTAL, FREET4, T3FREE, THYROIDAB in the last 72 hours. Anemia Panel: No results for input(s): VITAMINB12, FOLATE, FERRITIN, TIBC, IRON, RETICCTPCT in the last 72 hours. Sepsis Labs: Recent Labs  Lab 07/26/20 1701  LATICACIDVEN 1.0    Recent Results (from the past 240 hour(s))  Urine culture     Status: Abnormal   Collection Time: 07/26/20 12:44 PM   Specimen: Urine, Random  Result Value Ref Range Status   Specimen Description   Final    URINE, RANDOM Performed at [facility], [address] [place], [place] [address]    Special Requests   Final    NONE Performed at [facility], [address] [place], [place] [address]    Culture 60,000 COLONIES/mL STAPHYLOCOCCUS AUREUS (A)  Final   Report Status 07/29/2020 FINAL  Final   Organism ID, Bacteria STAPHYLOCOCCUS AUREUS (A)  Final      Susceptibility   Staphylococcus aureus - MIC*    CIPROFLOXACIN >=8 RESISTANT Resistant     GENTAMICIN <=0.5 SENSITIVE Sensitive     NITROFURANTOIN <=16 SENSITIVE Sensitive     OXACILLIN <=0.25 SENSITIVE Sensitive     TETRACYCLINE <=1 SENSITIVE Sensitive     VANCOMYCIN 1 SENSITIVE Sensitive     TRIMETH/SULFA <=10 SENSITIVE Sensitive     CLINDAMYCIN <=0.25 SENSITIVE Sensitive     RIFAMPIN <=0.5 SENSITIVE Sensitive     Inducible Clindamycin NEGATIVE  Sensitive     * 60,000 COLONIES/mL STAPHYLOCOCCUS AUREUS  Respiratory Panel by RT PCR (Flu A&B, Covid) - Nasopharyngeal Swab     Status:  None   Collection Time: 07/26/20  4:45 PM   Specimen: Nasopharyngeal Swab  Result Value Ref Range Status   SARS Coronavirus 2 by RT PCR NEGATIVE NEGATIVE Final    Comment: (NOTE) SARS-CoV-2 target nucleic acids are NOT DETECTED.  The SARS-CoV-2 RNA is generally detectable in upper respiratoy specimens during the acute phase of infection. The lowest concentration of SARS-CoV-2 viral copies this assay can detect is 131 copies/mL. A negative result does not preclude SARS-Cov-2 infection and should not be used as the sole basis for treatment or other patient management decisions. A negative result may occur with  improper specimen collection/handling, submission of specimen other than nasopharyngeal swab, presence of viral mutation(s) within the areas targeted by this assay, and inadequate number of viral copies (<131 copies/mL). A negative result must be combined with clinical observations, patient history, and epidemiological information. The expected result is Negative.  Fact Sheet for Patients:  PinkCheek.be  Fact Sheet for Healthcare Providers:  GravelBags.it  This test is no t yet approved or cleared by the [place] FDA and  has been authorized for detection and/or diagnosis of SARS-CoV-2 by FDA under an Emergency Use Authorization (EUA). This EUA will remain  in effect (meaning this test can be used) for the duration of the COVID-19 declaration under Section 564(b)(1) of the Act, 21 U.S.C. section 360bbb-3(b)(1), unless the authorization is terminated or revoked sooner.     Influenza A by PCR NEGATIVE NEGATIVE Final   Influenza B by PCR NEGATIVE NEGATIVE Final    Comment: (NOTE) The Xpert Xpress SARS-CoV-2/FLU/RSV assay is intended as an aid in  the diagnosis of influenza from  Nasopharyngeal swab specimens and  should not be used as a sole basis for treatment. Nasal washings and  aspirates are unacceptable for Xpert Xpress SARS-CoV-2/FLU/RSV  testing.  Fact Sheet for Patients: PinkCheek.be  Fact Sheet for Healthcare Providers: GravelBags.it  This test is not yet approved or cleared by the [place] FDA and  has been authorized for detection and/or diagnosis of SARS-CoV-2 by  FDA under an Emergency Use Authorization (EUA). This EUA will remain  in effect (meaning this test can be used) for the duration of the  Covid-19 declaration under Section 564(b)(1) of the Act, 21  U.S.C. section 360bbb-3(b)(1), unless the authorization is  terminated or revoked. Performed at [facility], [address] [place], [place] [address]   Culture, blood (routine x 2)     Status: None (Preliminary result)   Collection Time: 07/26/20  5:55 PM   Specimen: BLOOD RIGHT HAND  Result Value Ref Range Status   Specimen Description   Final    BLOOD RIGHT HAND Performed at [facility], [address] [place], [place] [address]    Special Requests   Final    BOTTLES DRAWN AEROBIC AND ANAEROBIC Blood Culture adequate volume Performed at [facility], [address] [place], [place] [address]    Culture   Final    NO GROWTH 3 DAYS Performed at [facility], [place] [address] [place], [place] [address]    Report Status PENDING  Incomplete  Culture, blood (routine x 2)     Status: None (Preliminary result)   Collection Time: 07/26/20  6:10 PM   Specimen: BLOOD  Result Value Ref Range Status   Specimen Description   Final    BLOOD LEFT ANTECUBITAL Performed at [facility], [place] [address] [place], [place]  [address]    Special Requests   Final    BOTTLES DRAWN AEROBIC AND ANAEROBIC Blood Culture adequate volume Performed at [facility], [address]  [place], [place] [address]    Culture   Final    NO GROWTH 3 DAYS Performed at [facility], [place] [address], [place], [place] [address]    Report Status PENDING  Incomplete  Aerobic/Anaerobic Culture (surgical/deep wound)     Status: None (Preliminary result)   Collection Time: 07/27/20 10:42 AM   Specimen: Abscess  Result Value Ref Range Status   Specimen Description   Final    ABSCESS Performed at [facility] [address] [place], [place] [address]    Special Requests   Final    NONE Performed at [facility], [place] [address] [place], [place] [address]    Gram Stain   Final    ABUNDANT WBC PRESENT, PREDOMINANTLY PMN ABUNDANT GRAM POSITIVE COCCI IN PAIRS IN CLUSTERS Performed at [facility], [place] [address], [place], [place] [address]    Culture   Final    ABUNDANT STAPHYLOCOCCUS AUREUS NO ANAEROBES ISOLATED; CULTURE IN PROGRESS FOR 5 DAYS    Report Status PENDING  Incomplete   Organism ID, Bacteria STAPHYLOCOCCUS AUREUS  Final      Susceptibility   Staphylococcus aureus - MIC*    CIPROFLOXACIN >=8 RESISTANT Resistant     ERYTHROMYCIN <=0.25 SENSITIVE Sensitive     GENTAMICIN <=0.5 SENSITIVE Sensitive     OXACILLIN <=0.25 SENSITIVE Sensitive     TETRACYCLINE <=1 SENSITIVE Sensitive     VANCOMYCIN 1 SENSITIVE Sensitive     TRIMETH/SULFA <=10 SENSITIVE Sensitive     CLINDAMYCIN <=0.25 SENSITIVE Sensitive     RIFAMPIN <=0.5 SENSITIVE Sensitive     Inducible Clindamycin NEGATIVE Sensitive     * ABUNDANT STAPHYLOCOCCUS AUREUS         Radiology Studies: CT ABDOMEN PELVIS W CONTRAST  Result Date: 07/29/2020 CLINICAL DATA:  Retroperitoneal abscess. EXAM: CT ABDOMEN AND PELVIS WITH CONTRAST TECHNIQUE: Multidetector CT imaging of the abdomen and pelvis was performed using the standard protocol following bolus administration of intravenous contrast. CONTRAST:  18mL OMNIPAQUE IOHEXOL 300 MG/ML  SOLN COMPARISON:  July 27, 2020. FINDINGS: Lower chest: Minimal right pleural effusion is noted with adjacent subsegmental atelectasis. Hepatobiliary: No focal liver abnormality is seen. No gallstones, gallbladder wall thickening, or biliary dilatation. Pancreas: Unremarkable. No pancreatic ductal dilatation or surrounding inflammatory changes. Spleen: Normal in size without focal abnormality. Adrenals/Urinary Tract: Adrenal glands are unremarkable. Stable left renal cyst is noted. No hydronephrosis or renal obstruction is noted. No renal or ureteral calculi are noted. Urinary bladder is unremarkable. There is been interval placement of percutaneous drainage catheter into probable right retroperitoneal abscess which is adjacent to lower pole of right kidney. The abscess appears to be significantly smaller compared to prior exam, although significant solid density remains suggesting some degree of phlegmon. A nother complex fluid collection is seen in the lateral portion of the right retroperitoneal region which extends down to the right lower quadrant and right inguinal region. Fluid is seen extending into the visualized portion of the upper right scrotum. Stomach/Bowel: Stomach is within normal limits. Appendix appears normal. No evidence of bowel wall thickening, distention, or inflammatory changes. Vascular/Lymphatic: No significant vascular findings are present. No enlarged abdominal or pelvic lymph nodes. Reproductive: Prostate is unremarkable. Other: No definite ascites is noted. Musculoskeletal: No acute or significant  osseous findings. IMPRESSION: 1. Interval placement of percutaneous drainage catheter into probable right retroperitoneal abscess which is significantly smaller compared to prior exam, although significant solid density remains suggesting some degree of phlegmon. Another complex fluid collection is seen in the lateral portion of the right retroperitoneal region which extends down to the right lower quadrant and right  inguinal region. Fluid is seen extending into the visualized portion of the upper right scrotum. 2. Minimal right pleural effusion is noted with adjacent subsegmental atelectasis. Electronically Signed   By: Marijo Conception M.D.   On: 07/29/2020 11:34   US SCROTUM W/DOPPLER  Result Date: 07/28/2020 CLINICAL DATA:  Scrotal sac enlargement EXAM: SCROTAL ULTRASOUND DOPPLER ULTRASOUND OF THE TESTICLES TECHNIQUE: Complete ultrasound examination of the testicles, epididymis, and other scrotal structures was performed. Color and spectral Doppler ultrasound were also utilized to evaluate blood flow to the testicles. COMPARISON:  CT abdomen pelvis 07/27/2020 FINDINGS: Right testicle Measurements: 4.8 x 2.9 x 3.3 cm. No mass or microlithiasis visualized. Left testicle Measurements: 4.8 x 2.0 x 3.0 cm. No mass or microlithiasis visualized. Right epididymis:  Normal in size and appearance. Left epididymis:  Normal in size and appearance. Hydrocele:  Small bilateral hydroceles. Varicocele:  Bilateral varicoceles visualized. Pulsed Doppler interrogation of both testes demonstrates normal low resistance arterial and venous waveforms bilaterally. Bilateral scrotal wall thickening. There is complex fluid distending within the right inguinal canal with low level internal echoes extending from the region of the pelvis to the scrotal sac maximal dimensions measuring 2.6 x 3.6 cm trans axially and extending up to approximately 19 cm in length. IMPRESSION: 1. Negative for testicular torsion or intratesticular mass. 2. Bilateral scrotal wall thickening. No evidence of scrotal wall fluid collection or abscess. 3. There is complex fluid tracking within the right inguinal canal extending from the pelvis to the right hemiscrotum, similar in appearance to the previous CT abdomen pelvis 07/27/2020. 4. Small bilateral hydroceles. 5. Bilateral varicoceles. Electronically Signed   By: Davina Poke D.O.   On: 07/28/2020 15:24         Scheduled Meds: . insulin aspart  0-15 Units Subcutaneous TID WC  . insulin aspart  0-5 Units Subcutaneous QHS  . insulin aspart  5 Units Subcutaneous TID WC  . insulin glargine  50 Units Subcutaneous Daily  . Ensure Max Protein  11 oz Oral BID  . sodium chloride flush  5 mL Intracatheter Q8H   Continuous Infusions: . ampicillin-sulbactam (UNASYN) IV 3 g (07/30/20 0516)  . methocarbamol (ROBAXIN) IV 500 mg (07/30/20 0429)     LOS: 4 days    Time spent: 35 mins.More than 50% of that time was spent in counseling and/or coordination of care.      Shelly Coss, MD [facility] P10/26/2021, 8:24 AM

## 2020-07-30 NOTE — Evaluation (Signed)
Physical Therapy Evaluation Patient Details Name: Paul Estes MRN: [id number] DOB: [birth year] Today's Date: 07/30/2020   History of Present Illness  64 year old male with a large right retroperitoneal/perinephric abscess status post percutaneous drain placement on 07/27/2020 now with worsening right testicular pain, persistent leukocytosis and markedly elevated blood glucose  Clinical Impression  On eval, pt was Min guard assist for mobility. He walked ~75 feet while pushing IV pole. Minimal pain with activity. Ambulation distance limited by low IV pole battery. Anticipate pt will progress well during hospital stay. Will plan to follow and progress activity as tolerated. Discussed d/c plan-pt plans to return home where he lives alone. He does not feel he will need HHPT f/u. Will continue to follow and update recs as needed.     Follow Up Recommendations No PT follow up    Equipment Recommendations  None recommended by PT    Recommendations for Other Services       Precautions / Restrictions Precautions Precautions: Fall Restrictions Weight Bearing Restrictions: No      Mobility  Bed Mobility               General bed mobility comments: oob in bathroom    Transfers Overall transfer level: Modified independent                  Ambulation/Gait Ambulation/Gait assistance: Min guard Gait Distance (Feet): 75 Feet Assistive device: IV Pole Gait Pattern/deviations: Step-through pattern;Decreased stride length     General Gait Details: Distance limited by low IV pole battery. Mildly unsteady without UE support but no overt LOB  Stairs            Wheelchair Mobility    Modified Rankin (Stroke Patients Only)       Balance Overall balance assessment: Mild deficits observed, not formally tested                                           Pertinent Vitals/Pain Pain Assessment: Faces Faces Pain Scale: Hurts a little bit Pain Location:  drain site Pain Descriptors / Indicators: Discomfort;Sore Pain Intervention(s): Monitored during session    Home Living Family/patient expects to be discharged to:: Private residence Living Arrangements: Alone   Type of Home: House Home Access: Stairs to enter   Technical brewer of Steps: 2 Home Layout: Bed/bath upstairs Home Equipment: None      Prior Function Level of Independence: Independent               Hand Dominance        Extremity/Trunk Assessment   Upper Extremity Assessment Upper Extremity Assessment: Overall WFL for tasks assessed    Lower Extremity Assessment Lower Extremity Assessment: Generalized weakness    Cervical / Trunk Assessment Cervical / Trunk Assessment: Normal  Communication   Communication: No difficulties  Cognition Arousal/Alertness: Awake/alert Behavior During Therapy: WFL for tasks assessed/performed Overall Cognitive Status: Within Functional Limits for tasks assessed                                        General Comments      Exercises     Assessment/Plan    PT Assessment Patient needs continued PT services  PT Problem List Decreased mobility;Decreased balance;Decreased activity tolerance       PT Treatment  Interventions      PT Goals (Current goals can be found in the Care Plan section)  Acute Rehab PT Goals Patient Stated Goal: to get rid of this infection PT Goal Formulation: With patient Time For Goal Achievement: 08/13/20 Potential to Achieve Goals: Good    Frequency Min 3X/week   Barriers to discharge        Co-evaluation               AM-PAC PT "6 Clicks" Mobility  Outcome Measure Help needed turning from your back to your side while in a flat bed without using bedrails?: None Help needed moving from lying on your back to sitting on the side of a flat bed without using bedrails?: None Help needed moving to and from a bed to a chair (including a wheelchair)?:  None Help needed standing up from a chair using your arms (e.g., wheelchair or bedside chair)?: None Help needed to walk in hospital room?: A Little Help needed climbing 3-5 steps with a railing? : A Little 6 Click Score: 22    End of Session   Activity Tolerance: Patient tolerated treatment well Patient left: in chair;with call bell/phone within reach   PT Visit Diagnosis: Unsteadiness on feet (R26.81)    Time: 7371-0626 PT Time Calculation (min) (ACUTE ONLY): 9 min   Charges:   PT Evaluation $PT Eval Low Complexity: 1 Low             Doreatha Massed, PT Acute Rehabilitation  Office: [phone] Pager: [phone]

## 2020-07-31 DIAGNOSIS — R935 Abnormal findings on diagnostic imaging of other abdominal regions, including retroperitoneum: Secondary | ICD-10-CM | POA: Diagnosis not present

## 2020-07-31 LAB — CBC WITH DIFFERENTIAL/PLATELET
Abs Immature Granulocytes: 0.35 10*3/uL — ABNORMAL HIGH (ref 0.00–0.07)
Basophils Absolute: 0.1 10*3/uL (ref 0.0–0.1)
Basophils Relative: 0 %
Eosinophils Absolute: 0.2 10*3/uL (ref 0.0–0.5)
Eosinophils Relative: 1 %
HCT: 35.1 % — ABNORMAL LOW (ref 39.0–52.0)
Hemoglobin: 11.2 g/dL — ABNORMAL LOW (ref 13.0–17.0)
Immature Granulocytes: 2 %
Lymphocytes Relative: 12 %
Lymphs Abs: 1.7 10*3/uL (ref 0.7–4.0)
MCH: 27.8 pg (ref 26.0–34.0)
MCHC: 31.9 g/dL (ref 30.0–36.0)
MCV: 87.1 fL (ref 80.0–100.0)
Monocytes Absolute: 0.8 10*3/uL (ref 0.1–1.0)
Monocytes Relative: 5 %
Neutro Abs: 11.6 10*3/uL — ABNORMAL HIGH (ref 1.7–7.7)
Neutrophils Relative %: 80 %
Platelets: 510 10*3/uL — ABNORMAL HIGH (ref 150–400)
RBC: 4.03 MIL/uL — ABNORMAL LOW (ref 4.22–5.81)
RDW: 14 % (ref 11.5–15.5)
WBC: 14.7 10*3/uL — ABNORMAL HIGH (ref 4.0–10.5)
nRBC: 0 % (ref 0.0–0.2)

## 2020-07-31 LAB — GLUCOSE, CAPILLARY
Glucose-Capillary: 171 mg/dL — ABNORMAL HIGH (ref 70–99)
Glucose-Capillary: 170 mg/dL — ABNORMAL HIGH (ref 70–99)
Glucose-Capillary: 197 mg/dL — ABNORMAL HIGH (ref 70–99)
Glucose-Capillary: 126 mg/dL — ABNORMAL HIGH (ref 70–99)

## 2020-07-31 LAB — BASIC METABOLIC PANEL
Anion gap: 9 (ref 5–15)
BUN: 16 mg/dL (ref 8–23)
CO2: 25 mmol/L (ref 22–32)
Calcium: 8.1 mg/dL — ABNORMAL LOW (ref 8.9–10.3)
Chloride: 100 mmol/L (ref 98–111)
Creatinine, Ser: 0.62 mg/dL (ref 0.61–1.24)
GFR, Estimated: >60 mL/min (ref >60)
Glucose, Bld: 175 mg/dL — ABNORMAL HIGH (ref 70–99)
Potassium: 3.8 mmol/L (ref 3.5–5.1)
Sodium: 134 mmol/L — ABNORMAL LOW (ref 135–145)

## 2020-07-31 MED ORDER — INSULIN ASPART 100 UNIT/ML ~~LOC~~ SOLN
8.0000 [IU] | Freq: Three times a day (TID) | SUBCUTANEOUS | Status: DC
  Administered 2020-07-31 – 2020-08-01 (×2): 8 [IU] via SUBCUTANEOUS

## 2020-07-31 MED ORDER — INFLUENZA VAC SPLIT QUAD 0.5 ML IM SUSY
0.5000 mL | PREFILLED_SYRINGE | INTRAMUSCULAR | Status: AC
  Administered 2020-08-01: 0.5 mL via INTRAMUSCULAR
  Filled 2020-07-31: qty 0.5

## 2020-07-31 MED ORDER — AMOXICILLIN-POT CLAVULANATE 875-125 MG PO TABS
1.0000 | ORAL_TABLET | Freq: Two times a day (BID) | ORAL | Status: DC
  Administered 2020-07-31 – 2020-08-01 (×3): 1 via ORAL
  Filled 2020-07-31 (×3): qty 1

## 2020-07-31 NOTE — Progress Notes (Signed)
Nutrition Note  RD consulted for nutrition education regarding diabetes.   Lab Results  Component Value Date   HGBA1C 14.3 (H) 07/28/2020    RD provided "Carbohydrate Counting for People with Diabetes" handout from the Academy of Nutrition and Dietetics. Discussed different food groups and their effects on blood sugar, emphasizing carbohydrate-containing foods. Provided list of carbohydrates and recommended serving sizes of common foods.  Discussed importance of controlled and consistent carbohydrate intake throughout the day. Provided examples of ways to balance meals/snacks and encouraged intake of high-fiber, whole grain complex carbohydrates. Teach back method used.  Expect good compliance. Pt seems motivated for change. Very engaged in education.  Body mass index is 31.42 kg/m. Pt meets criteria for obesity based on current BMI.  Current diet order is CHO modified, patient is consuming approximately 100% of meals at this time. Labs and medications reviewed. No further nutrition interventions warranted at this time. RD contact information provided. If additional nutrition issues arise, please re-consult RD.  Clayton Bibles, MS, RD, LDN Inpatient Clinical Dietitian Contact information available via Amion

## 2020-07-31 NOTE — Progress Notes (Signed)
PROGRESS NOTE    Paul Estes  ZCH:[id number] DOB: Sep 28, 1956 DOA: 07/26/2020 PCP: [facility]   Chief Complain: Abdominal pain  Brief Narrative:  Patient is a 64 year old male with no significant past medical history with who was recently placed on steroids for low back pain/sciatica who presented with complaints of abdominal pain on the right side.  He  also felt that there is some swelling on the right lower quadrant.  On presentation he was febrile, had leukocytosis, elevated blood glucose.  CT abdomen/pelvis was concerning for enhancing collection in the right pararenal space concerning for retroperitoneal abscess.  Urology/IR consulted.  Started on empiric antibiotics.  He underwent IR guided retroperitoneal abscess drain catheteron 10/28/19.  Abscess culture, urine cultures showed MSSA.Marland Kitchen    Assessment & Plan:   Principal Problem:   Abnormal CT of the abdomen Active Problems:   Flank pain   Retroperitoneal abscess involving the right pararenal space/renal abscess:There was also suspicion for  renal mass but given his significant leukocytosis, fever, abscess was  favored. Urology consulted and following.  IR also consulted he underwent retroperitoneal abscess drain catheter on 07/27/2020 and there was frank drainage of abscess of the drain.  Urine culture, wound culture showing MSSA.  Most likely the source is urine.Blood cultures negative so far.Currently he is afebrile, leukocytosis is improving. His abdominal pain, back pain has improved . CT showed decease in size of the last abscess, but showed another complex fluid collection is seen in the lateral portion of the right retroperitoneal region which extends down to the right lower quadrant and right inguinal region. Fluid was seen extending into the visualized portion of the upper right scrotum. IR said that the fluid collections might have connections,no need to put another drain. Change antibiotics  to Augmentin.  We will continue antibiotics for total 2 weeks course.   Hyperglycemia/uncontrolled diabetes mellitus type 2: Has severe undiagnosed/uncontrolled diabetes mellitus.  Hemoglobin A1c in the range of 14 .  Continue current insulin regimen.  Monitor blood sugars.  Diabetic coordinator consulted.  He needs insulin on discharge.  Scrotal edema: new problem.  Ultrasound of the scrotum did not show testicular torsion/abscess.  This is secondary to extravasation of retroperitoneal collection/abscess into the right scrotum through inguinal canal.  Iron deficiency normocytic anemia: Iron panel showed iron deficiency.  Given a 1 dose of IV iron  Low back pain: Was on steroids for sciatica at home.  Complains of severe back pain with right lower extremity numbness/tingling.  Continue pain medications,muscle  Relaxants.PT consulted,no follow up recommended  Hypokalemia: Being supplemented with potassium  Hyponatremia: Sodium of 134 now      Nutrition Problem: Increased nutrient needs Etiology:  (retroperitonael abscess)      DVT prophylaxis:SCD Code Status: Full Family Communication: None at bed side Status is: Inpatient  Remains inpatient appropriate because:Inpatient level of care appropriate due to severity of illness   Dispo: The patient is from: Home              Anticipated d/c is to: Home              Anticipated d/c date OI:[facility]              Patient currently is not medically stable to d/c.  He still complains of increased discomfort in the right inguinal region and right scrotum ,so discharge held today.  Consultants: Urology,IR  Procedures:None  Antimicrobials:  Anti-infectives (From admission, onward)   Start  Dose/Rate Route Frequency Ordered Stop   07/31/20 1430  amoxicillin-clavulanate (AUGMENTIN) 875-125 MG per tablet 1 tablet        1 tablet Oral Every 12 hours 07/31/20 1339     07/29/20 2200  Ampicillin-Sulbactam (UNASYN) 3 g in sodium  chloride 0.9 % 100 mL IVPB  Status:  Discontinued        3 g 200 mL/hr over 30 Minutes Intravenous Every 6 hours 07/29/20 1351 07/31/20 1339   07/29/20 1030  metroNIDAZOLE (FLAGYL) IVPB 500 mg  Status:  Discontinued        500 mg 100 mL/hr over 60 Minutes Intravenous Every 8 hours 07/29/20 0932 07/29/20 1351   07/29/20 0900  levofloxacin (LEVAQUIN) IVPB 750 mg  Status:  Discontinued        750 mg 100 mL/hr over 90 Minutes Intravenous Every 24 hours 07/29/20 0748 07/29/20 1351   07/28/20 2200  vancomycin (VANCOCIN) IVPB 1000 mg/200 mL premix  Status:  Discontinued        1,000 mg 200 mL/hr over 60 Minutes Intravenous Every 12 hours 07/28/20 0950 07/29/20 1351   07/28/20 1045  vancomycin (VANCOREADY) IVPB 2000 mg/400 mL        2,000 mg 200 mL/hr over 120 Minutes Intravenous STAT 07/28/20 0946 07/28/20 1220   07/27/20 0600  piperacillin-tazobactam (ZOSYN) IVPB 3.375 g  Status:  Discontinued        3.375 g 12.5 mL/hr over 240 Minutes Intravenous Every 8 hours 07/27/20 0506 07/29/20 0748   07/26/20 1345  cefTRIAXone (ROCEPHIN) 2 g in sodium chloride 0.9 % 100 mL IVPB       "And" Linked Group Details   2 g 200 mL/hr over 30 Minutes Intravenous  Once 07/26/20 1336 07/26/20 1444   07/26/20 1345  metroNIDAZOLE (FLAGYL) IVPB 500 mg       "And" Linked Group Details   500 mg 100 mL/hr over 60 Minutes Intravenous  Once 07/26/20 1336 07/26/20 1653      Subjective:  Patient seen and examined at the bedside this morning.  Hemodynamically stable.  Sitting on the chair.  Afebrile.  He complains of discomfort on the right inguinal area and scrotum today.  Discussed with urology, plan to monitor for 1 more night.  Plan for discharge tomorrow.  Objective: Vitals:   07/30/20 0450 07/30/20 1210 07/30/20 2100 07/31/20 0500  BP: (!) 143/82 135/71 (!) 149/70 (!) 154/87  Pulse: 81 84 90 82  Resp: 18 18 18    Temp: 98.4 F (36.9 C) 98.1 F (36.7 C) 98.9 F (37.2 C) 98.2 F (36.8 C)  TempSrc: Oral Oral  Oral Oral  SpO2: 90% 95% 92% 96%  Weight:      Height:        Intake/Output Summary (Last 24 hours) at 07/31/2020 1339 Last data filed at 07/31/2020 1201 Gross per 24 hour  Intake 130 ml  Output 1787 ml  Net -1657 ml   Filed Weights   07/26/20 1204 07/27/20 0229  Weight: 106.1 kg 105.1 kg    Examination:  General exam: Not in distress,obese HEENT:PERRL,Oral mucosa moist, Ear/Nose normal on gross exam Respiratory system: Bilateral equal air entry, normal vesicular breath sounds, no wheezes or crackles  Cardiovascular system: S1 & S2 heard, RRR. No JVD, murmurs, rubs, gallops or clicks. Gastrointestinal system: Abdomen is nondistended, soft and nontender. No organomegaly or masses felt. Normal bowel sounds heard.  Drain on the right back Central nervous system: Alert and oriented. No focal neurological deficits. Extremities: No edema, no  clubbing ,no cyanosis, distal peripheral pulses palpable. Skin: Flushed face GU: Scrotal swelling  Data Reviewed: I have personally reviewed following labs and imaging studies  CBC: Recent Labs  Lab 07/27/20 0612 07/28/20 0557 07/29/20 0535 07/30/20 0531 07/31/20 0451  WBC 24.3* 23.2* 22.2* 17.1* 14.7*  NEUTROABS 22.0* 20.7* 19.6* 14.5* 11.6*  HGB 10.9* 10.7* 11.2* 11.0* 11.2*  HCT 34.1* 34.1* 33.9* 33.7* 35.1*  MCV 88.1 87.9 84.8 86.2 87.1  PLT 474* 484* 459* 454* 902*   Basic Metabolic Panel: Recent Labs  Lab 07/27/20 0612 07/28/20 0557 07/29/20 0535 07/30/20 0531 07/31/20 0451  NA 135 129* 132* 133* 134*  K 3.7 3.3* 3.3* 3.4* 3.8  CL 101 94* 96* 98 100  CO2 18* 22 25 25 25   GLUCOSE 307* 314* 251* 220* 175*  BUN 15 12 12 13 16   CREATININE 0.79 0.69 0.56* 0.54* 0.62  CALCIUM 8.3* 8.0* 8.1* 8.0* 8.1*  MG  --  1.8  --   --   --    GFR: Estimated Creatinine Clearance: 116.9 mL/min (by C-G formula based on SCr of 0.62 mg/dL). Liver Function Tests: Recent Labs  Lab 07/26/20 1243 07/27/20 0612  AST 12* 9*  ALT 13 11   ALKPHOS 122 127*  BILITOT 0.7 1.4*  PROT 7.2 7.0  ALBUMIN 2.3* 2.4*   Recent Labs  Lab 07/26/20 1243  LIPASE 18   No results for input(s): AMMONIA in the last 168 hours. Coagulation Profile: Recent Labs  Lab 07/26/20 1701 07/27/20 0612  INR 1.3* 1.3*   Cardiac Enzymes: No results for input(s): CKTOTAL, CKMB, CKMBINDEX, TROPONINI in the last 168 hours. BNP (last 3 results) No results for input(s): PROBNP in the last 8760 hours. HbA1C: No results for input(s): HGBA1C in the last 72 hours. CBG: Recent Labs  Lab 07/30/20 1208 07/30/20 1702 07/30/20 2120 07/31/20 0734 07/31/20 1159  GLUCAP 217* 210* 211* 171* 170*   Lipid Profile: No results for input(s): CHOL, HDL, LDLCALC, TRIG, CHOLHDL, LDLDIRECT in the last 72 hours. Thyroid Function Tests: No results for input(s): TSH, T4TOTAL, FREET4, T3FREE, THYROIDAB in the last 72 hours. Anemia Panel: No results for input(s): VITAMINB12, FOLATE, FERRITIN, TIBC, IRON, RETICCTPCT in the last 72 hours. Sepsis Labs: Recent Labs  Lab 07/26/20 1701  LATICACIDVEN 1.0    Recent Results (from the past 240 hour(s))  Urine culture     Status: Abnormal   Collection Time: 07/26/20 12:44 PM   Specimen: Urine, Random  Result Value Ref Range Status   Specimen Description   Final    URINE, RANDOM Performed at [facility], [address] [place], [place] [address]    Special Requests   Final    NONE Performed at [facility], [address] [place], [place] [address]    Culture 60,000 COLONIES/mL STAPHYLOCOCCUS AUREUS (A)  Final   Report Status 07/29/2020 FINAL  Final   Organism ID, Bacteria STAPHYLOCOCCUS AUREUS (A)  Final      Susceptibility   Staphylococcus aureus - MIC*    CIPROFLOXACIN >=8 RESISTANT Resistant     GENTAMICIN <=0.5 SENSITIVE Sensitive     NITROFURANTOIN <=16 SENSITIVE Sensitive     OXACILLIN <=0.25 SENSITIVE Sensitive     TETRACYCLINE <=1 SENSITIVE Sensitive     VANCOMYCIN 1  SENSITIVE Sensitive     TRIMETH/SULFA <=10 SENSITIVE Sensitive     CLINDAMYCIN <=0.25 SENSITIVE Sensitive     RIFAMPIN <=0.5 SENSITIVE Sensitive     Inducible Clindamycin NEGATIVE  Sensitive     * 60,000 COLONIES/mL STAPHYLOCOCCUS AUREUS  Respiratory Panel by RT PCR (Flu A&B, Covid) - Nasopharyngeal Swab     Status: None   Collection Time: 07/26/20  4:45 PM   Specimen: Nasopharyngeal Swab  Result Value Ref Range Status   SARS Coronavirus 2 by RT PCR NEGATIVE NEGATIVE Final    Comment: (NOTE) SARS-CoV-2 target nucleic acids are NOT DETECTED.  The SARS-CoV-2 RNA is generally detectable in upper respiratoy specimens during the acute phase of infection. The lowest concentration of SARS-CoV-2 viral copies this assay can detect is 131 copies/mL. A negative result does not preclude SARS-Cov-2 infection and should not be used as the sole basis for treatment or other patient management decisions. A negative result may occur with  improper specimen collection/handling, submission of specimen other than nasopharyngeal swab, presence of viral mutation(s) within the areas targeted by this assay, and inadequate number of viral copies (<131 copies/mL). A negative result must be combined with clinical observations, patient history, and epidemiological information. The expected result is Negative.  Fact Sheet for Patients:  PinkCheek.be  Fact Sheet for Healthcare Providers:  GravelBags.it  This test is no t yet approved or cleared by the [place] FDA and  has been authorized for detection and/or diagnosis of SARS-CoV-2 by FDA under an Emergency Use Authorization (EUA). This EUA will remain  in effect (meaning this test can be used) for the duration of the COVID-19 declaration under Section 564(b)(1) of the Act, 21 U.S.C. section 360bbb-3(b)(1), unless the authorization is terminated or revoked sooner.     Influenza A by PCR  NEGATIVE NEGATIVE Final   Influenza B by PCR NEGATIVE NEGATIVE Final    Comment: (NOTE) The Xpert Xpress SARS-CoV-2/FLU/RSV assay is intended as an aid in  the diagnosis of influenza from Nasopharyngeal swab specimens and  should not be used as a sole basis for treatment. Nasal washings and  aspirates are unacceptable for Xpert Xpress SARS-CoV-2/FLU/RSV  testing.  Fact Sheet for Patients: PinkCheek.be  Fact Sheet for Healthcare Providers: GravelBags.it  This test is not yet approved or cleared by the [place] FDA and  has been authorized for detection and/or diagnosis of SARS-CoV-2 by  FDA under an Emergency Use Authorization (EUA). This EUA will remain  in effect (meaning this test can be used) for the duration of the  Covid-19 declaration under Section 564(b)(1) of the Act, 21  U.S.C. section 360bbb-3(b)(1), unless the authorization is  terminated or revoked. Performed at [facility], [address] [place], [place] [address]   Culture, blood (routine x 2)     Status: None (Preliminary result)   Collection Time: 07/26/20  5:55 PM   Specimen: BLOOD RIGHT HAND  Result Value Ref Range Status   Specimen Description   Final    BLOOD RIGHT HAND Performed at [facility], [address] [place], [place] [address]    Special Requests   Final    BOTTLES DRAWN AEROBIC AND ANAEROBIC Blood Culture adequate volume Performed at [facility], [address] [place], [place] [address]    Culture   Final    NO GROWTH 4 DAYS Performed at [facility], [place] [address], [place], [place] [address]    Report Status PENDING  Incomplete  Culture, blood (routine x 2)     Status: None (Preliminary result)   Collection Time: 07/26/20  6:10 PM   Specimen: BLOOD  Result Value Ref  Range Status   Specimen Description   Final    BLOOD LEFT ANTECUBITAL Performed at [facility], [place]  [address], [place], [place] [address]    Special Requests   Final    BOTTLES DRAWN AEROBIC AND ANAEROBIC Blood Culture adequate volume Performed at [facility], [address] [place], [place] [address]    Culture   Final    NO GROWTH 4 DAYS Performed at [facility], [place] [address] [place], [place] [address]    Report Status PENDING  Incomplete  Aerobic/Anaerobic Culture (surgical/deep wound)     Status: None (Preliminary result)   Collection Time: 07/27/20 10:42 AM   Specimen: Abscess  Result Value Ref Range Status   Specimen Description   Final    ABSCESS Performed at [place] [address], [place], [place] [address]    Special Requests   Final    NONE Performed at [facility], [place] [address], [place], [place] [address]    Gram Stain   Final    ABUNDANT WBC PRESENT, PREDOMINANTLY PMN ABUNDANT GRAM POSITIVE COCCI IN PAIRS IN CLUSTERS Performed at [facility], [place] [address], [place],  [address]    Culture   Final    ABUNDANT STAPHYLOCOCCUS AUREUS NO ANAEROBES ISOLATED; CULTURE IN PROGRESS FOR 5 DAYS    Report Status PENDING  Incomplete   Organism ID, Bacteria STAPHYLOCOCCUS AUREUS  Final      Susceptibility   Staphylococcus aureus - MIC*    CIPROFLOXACIN >=8 RESISTANT Resistant     ERYTHROMYCIN <=0.25 SENSITIVE Sensitive     GENTAMICIN <=0.5 SENSITIVE Sensitive     OXACILLIN <=0.25 SENSITIVE Sensitive     TETRACYCLINE <=1 SENSITIVE Sensitive     VANCOMYCIN 1 SENSITIVE Sensitive     TRIMETH/SULFA <=10 SENSITIVE Sensitive     CLINDAMYCIN <=0.25 SENSITIVE Sensitive     RIFAMPIN <=0.5 SENSITIVE Sensitive     Inducible Clindamycin NEGATIVE Sensitive     * ABUNDANT STAPHYLOCOCCUS AUREUS         Radiology Studies: No results found.      Scheduled Meds: . amoxicillin-clavulanate  1 tablet Oral Q12H  . [START ON 08/01/2020] influenza vac split quadrivalent PF  0.5 mL Intramuscular  Tomorrow-1000  . insulin aspart  0-15 Units Subcutaneous TID WC  . insulin aspart  0-5 Units Subcutaneous QHS  . insulin aspart  8 Units Subcutaneous TID WC  . insulin glargine  50 Units Subcutaneous Daily  . polyethylene glycol  17 g Oral Daily  . Ensure Max Protein  11 oz Oral BID  . senna-docusate  1 tablet Oral BID  . sodium chloride flush  5 mL Intracatheter Q8H   Continuous Infusions: . methocarbamol (ROBAXIN) IV 500 mg (07/30/20 0429)     LOS: 5 days    Time spent: 25 mins.More than 50% of that time was spent in counseling and/or coordination of care.      Shelly Coss, MD [facility] P10/27/2021, 1:39 PM

## 2020-07-31 NOTE — Progress Notes (Signed)
Inpatient Diabetes Program Recommendations  AACE/ADA: New Consensus Statement on Inpatient Glycemic Control (2015)  Target Ranges:  Prepandial:   less than 140 mg/dL      Peak postprandial:   less than 180 mg/dL (1-2 hours)      Critically ill patients:  140 - 180 mg/dL   Lab Results  Component Value Date   GLUCAP 171 (H) 07/31/2020   HGBA1C 14.3 (H) 07/28/2020    Review of Glycemic Control Results for URBANO, [place] (MRN [id number]) as of 07/31/2020 09:51  Ref. Range 07/30/2020 07:40 07/30/2020 12:08 07/30/2020 17:02 07/30/2020 21:20 07/31/2020 07:34  Glucose-Capillary Latest Ref Range: 70 - 99 mg/dL 215 (H) Novolog 10 units 217 (H) Novolog 10 units 210 (H) Novolog 10 units 211 (H) Novolog 2 units 171 (H) Novolog 8 units   No History of Diabetes Recent use of Steroids at home for Back Pain Current Orders: Lantus 40 units Daily                            Novolog 5 units TID with meals                            Novolog Sensitive Correction Scale/ SSI (0-9 units) tid + hs 0-5 units  Inpatient Diabetes Program Recommendations:    -Consider increase in Novolog meal coverage to 8 units tid if eats 50%  Educated patient on insulin pen use at home on 07/30/20. Reviewed contents of insulin flexpen starter kit. Reviewed all steps if insulin pen including attachment of needle, 2-unit air shot, dialing up dose, giving injection, removing needle, disposal of sharps, storage of unused insulin, disposal of insulin etc. Patient able to provide successful return demonstration. Also reviewed troubleshooting with insulin pen. MD to give patient Rxs for insulin pens and insulin pen needles. Patient verbalized understanding of why he needs insulin and plans to start watching his carbohydrate intake @ home.  Patient shared he has lost approximately 60 lbs over the past year due to stress going through separation from his wife of >40 years.  Discharge needs: Lantus SoloStar Insulin Pen (Order #  [id number]) Novolog Flexpen Insulin Pen (Order # [id number]) Insulin Pen Needles 32 gauge x 28m [id number] Meter and Supplies (Order # [id number]  Thank you, JNani Gasser Andric Kerce, RN, MSN, CDE  Diabetes Coordinator Inpatient Glycemic Control Team Team Pager #[phone](8am-5pm) 07/31/2020 10:10 AM

## 2020-07-31 NOTE — Progress Notes (Signed)
Referring Physician(s): Dr. Lovena Neighbours  Supervising Physician: Jacqulynn Cadet  Patient Status:  [facility] - In-pt  Chief Complaint: Abdominal pain; S/p retroperitoneal abscess drain placement 07/27/20  Subjective: Patient in bed, awake and alert. He denies significant pain or discomfort. He states he is planning to be discharged tomorrow.   Allergies: Patient has no known allergies.  Medications: Prior to Admission medications   Medication Sig Start Date End Date Taking? Authorizing Provider  cetirizine (ZYRTEC) 10 MG tablet Take 10 mg by mouth daily.   Yes [provider]  cyclobenzaprine (FLEXERIL) 10 MG tablet Take 1 tablet (10 mg total) by mouth 2 (two) times daily as needed for muscle spasms. 07/23/20  Yes Sharion Balloon, NP  fluticasone (FLONASE) 50 MCG/ACT nasal spray Place into both nostrils daily.   Yes [provider]  ibuprofen (ADVIL) 800 MG tablet Take 1 tablet (800 mg total) by mouth every 8 (eight) hours as needed. 07/23/20  Yes Sharion Balloon, NP  predniSONE (STERAPRED UNI-PAK 21 TAB) 10 MG (21) TBPK tablet Take by mouth daily. As directed 07/23/20  Yes Sharion Balloon, NP  aspirin 325 MG tablet Take 325 mg by mouth daily. Patient not taking: Reported on 07/26/2020    [provider]  azithromycin (ZITHROMAX) 250 MG tablet Take 1 tablet (250 mg total) by mouth daily. Take first 2 tablets together, then 1 every day until finished. Patient not taking: Reported on 07/26/2020 07/13/20   Sharion Balloon, NP  diclofenac (VOLTAREN) 75 MG EC tablet Take 1 tablet (75 mg total) by mouth 2 (two) times daily. Patient not taking: Reported on 07/26/2020 03/29/17   Wallene Huh, DPM     Vital Signs: BP (!) 147/76 (BP Location: Left Arm)   Pulse 83   Temp 98.6 F (37 C) (Oral)   Resp (!) 21   Ht 6' (1.829 m)   Wt 231 lb 11.3 oz (105.1 kg)   SpO2 92%   BMI 31.42 kg/m   Physical Exam Pulmonary:     Effort: Pulmonary effort is normal.  Abdominal:      Comments: Right lower back abscess drain to gravity. Approximately 30-40 cc of purulent fluid in gravity bag. Drain easily flushed with 5 cc NS.   Skin:    General: Skin is warm and dry.  Neurological:     Mental Status: He is alert and oriented to person, place, and time.     Imaging: CT ABDOMEN PELVIS W CONTRAST  Result Date: 07/29/2020 CLINICAL DATA:  Retroperitoneal abscess. EXAM: CT ABDOMEN AND PELVIS WITH CONTRAST TECHNIQUE: Multidetector CT imaging of the abdomen and pelvis was performed using the standard protocol following bolus administration of intravenous contrast. CONTRAST:  162mL OMNIPAQUE IOHEXOL 300 MG/ML  SOLN COMPARISON:  July 27, 2020. FINDINGS: Lower chest: Minimal right pleural effusion is noted with adjacent subsegmental atelectasis. Hepatobiliary: No focal liver abnormality is seen. No gallstones, gallbladder wall thickening, or biliary dilatation. Pancreas: Unremarkable. No pancreatic ductal dilatation or surrounding inflammatory changes. Spleen: Normal in size without focal abnormality. Adrenals/Urinary Tract: Adrenal glands are unremarkable. Stable left renal cyst is noted. No hydronephrosis or renal obstruction is noted. No renal or ureteral calculi are noted. Urinary bladder is unremarkable. There is been interval placement of percutaneous drainage catheter into probable right retroperitoneal abscess which is adjacent to lower pole of right kidney. The abscess appears to be significantly smaller compared to prior exam, although significant solid density remains suggesting some degree of phlegmon. A nother complex  fluid collection is seen in the lateral portion of the right retroperitoneal region which extends down to the right lower quadrant and right inguinal region. Fluid is seen extending into the visualized portion of the upper right scrotum. Stomach/Bowel: Stomach is within normal limits. Appendix appears normal. No evidence of bowel wall thickening, distention, or  inflammatory changes. Vascular/Lymphatic: No significant vascular findings are present. No enlarged abdominal or pelvic lymph nodes. Reproductive: Prostate is unremarkable. Other: No definite ascites is noted. Musculoskeletal: No acute or significant osseous findings. IMPRESSION: 1. Interval placement of percutaneous drainage catheter into probable right retroperitoneal abscess which is significantly smaller compared to prior exam, although significant solid density remains suggesting some degree of phlegmon. Another complex fluid collection is seen in the lateral portion of the right retroperitoneal region which extends down to the right lower quadrant and right inguinal region. Fluid is seen extending into the visualized portion of the upper right scrotum. 2. Minimal right pleural effusion is noted with adjacent subsegmental atelectasis. Electronically Signed   By: Marijo Conception M.D.   On: 07/29/2020 11:34   US SCROTUM W/DOPPLER  Result Date: 07/28/2020 CLINICAL DATA:  Scrotal sac enlargement EXAM: SCROTAL ULTRASOUND DOPPLER ULTRASOUND OF THE TESTICLES TECHNIQUE: Complete ultrasound examination of the testicles, epididymis, and other scrotal structures was performed. Color and spectral Doppler ultrasound were also utilized to evaluate blood flow to the testicles. COMPARISON:  CT abdomen pelvis 07/27/2020 FINDINGS: Right testicle Measurements: 4.8 x 2.9 x 3.3 cm. No mass or microlithiasis visualized. Left testicle Measurements: 4.8 x 2.0 x 3.0 cm. No mass or microlithiasis visualized. Right epididymis:  Normal in size and appearance. Left epididymis:  Normal in size and appearance. Hydrocele:  Small bilateral hydroceles. Varicocele:  Bilateral varicoceles visualized. Pulsed Doppler interrogation of both testes demonstrates normal low resistance arterial and venous waveforms bilaterally. Bilateral scrotal wall thickening. There is complex fluid distending within the right inguinal canal with low level  internal echoes extending from the region of the pelvis to the scrotal sac maximal dimensions measuring 2.6 x 3.6 cm trans axially and extending up to approximately 19 cm in length. IMPRESSION: 1. Negative for testicular torsion or intratesticular mass. 2. Bilateral scrotal wall thickening. No evidence of scrotal wall fluid collection or abscess. 3. There is complex fluid tracking within the right inguinal canal extending from the pelvis to the right hemiscrotum, similar in appearance to the previous CT abdomen pelvis 07/27/2020. 4. Small bilateral hydroceles. 5. Bilateral varicoceles. Electronically Signed   By: Davina Poke D.O.   On: 07/28/2020 15:24    Labs:  CBC: Recent Labs    07/28/20 0557 07/29/20 0535 07/30/20 0531 07/31/20 0451  WBC 23.2* 22.2* 17.1* 14.7*  HGB 10.7* 11.2* 11.0* 11.2*  HCT 34.1* 33.9* 33.7* 35.1*  PLT 484* 459* 454* 510*    COAGS: Recent Labs    07/26/20 1701 07/27/20 0612  INR 1.3* 1.3*  APTT 28  --     BMP: Recent Labs    07/28/20 0557 07/29/20 0535 07/30/20 0531 07/31/20 0451  NA 129* 132* 133* 134*  K 3.3* 3.3* 3.4* 3.8  CL 94* 96* 98 100  CO2 22 25 25 25   GLUCOSE 314* 251* 220* 175*  BUN 12 12 13 16   CALCIUM 8.0* 8.1* 8.0* 8.1*  CREATININE 0.69 0.56* 0.54* 0.62  GFRNONAA >60 >60 >60 >60    LIVER FUNCTION TESTS: Recent Labs    07/26/20 1243 07/27/20 0612  BILITOT 0.7 1.4*  AST 12* 9*  ALT 13 11  ALKPHOS 122 127*  PROT 7.2 7.0  ALBUMIN 2.3* 2.4*    Assessment and Plan:  Retroperitoneal abscess, s/p drain placement 07/27/20: 10 cc output documented in Epic, another 30-40 cc of purulent fluid in gravity bag. Patient with plans for d/c home tomorrow. He states his sister will be helping him at home. Orders placed for patient to follow up with [facility] as an outpatient in 10-14 days. A scheduler from our office will call him to arrange this appointment. The patient will need to flush the drain once daily with at  least 5 cc NS. He will need to document the output daily and keep the site clean and dry. All of this information was shared with the patient. All of this information is on the AVS.  Please call IR with any questions.   Electronically Signed: Soyla Dryer, AGACNP-BC [phone] 07/31/2020, 5:00 PM   I spent a total of 15 Minutes at the the patient's bedside AND on the patient's hospital floor or unit, greater than 50% of which was counseling/coordinating care for retroperitoneal abscess drain care.

## 2020-07-31 NOTE — Progress Notes (Signed)
  Subjective: Pt had an episode of right inguinal pain last night that caused nausea and lightheadedness.  Right inguinal and testicular pain is more manageable this morning.   WBC improving and he has remained afebrile.   Objective: Vital signs in last 24 hours: Temp:  [98.1 F (36.7 C)-98.9 F (37.2 C)] 98.2 F (36.8 C) (10/27 0500) Pulse Rate:  [82-90] 82 (10/27 0500) Resp:  [18] 18 (10/26 2100) BP: (135-154)/(70-87) 154/87 (10/27 0500) SpO2:  [92 %-96 %] 96 % (10/27 0500)  Intake/Output from previous day: 10/26 0701 - 10/27 0700 In: 370 [P.O.:360] Out: 1160 [Urine:1150; Drains:10]  Intake/Output this shift: Total I/O In: 120 [P.O.:120] Out: 327 [Urine:326; Stool:1]  Physical Exam:  General: Alert and orientef GU:  Mild scrotal edema.  The right testicle continues to be tender to palpation, firm and indurated--induration extends into the right inguinal canal.  No evidence of scrotal skin erythema, cellulitis or necrosis. Left testicle is palpable and exhibits no masses or lesions.   Lab Results: Recent Labs    07/29/20 0535 07/30/20 0531 07/31/20 0451  HGB 11.2* 11.0* 11.2*  HCT 33.9* 33.7* 35.1*   BMET Recent Labs    07/30/20 0531 07/31/20 0451  NA 133* 134*  K 3.4* 3.8  CL 98 100  CO2 25 25  GLUCOSE 220* 175*  BUN 13 16  CREATININE 0.54* 0.62  CALCIUM 8.0* 8.1*     Studies/Results: No results found.  Assessment/Plan: 64 year old male with a large right retroperitoneal/perinephricabscess status post percutaneous drain placement on 07/27/2020.  Now with a right inguinal fluid collection extending down into the right hemiscrotum.   -IR deferred additional drain placement to address his new right retroperitoneal/inguinal fluid collection -Exam today is stable.  Leukocytosis is improving with IV ampicillin.  No fevers for the past 24 hours. -Recommend transitioning to PO abx and repeating a scrotal exam tomorrow.  If stable, ok for d/c.  He will need  home health for mgmt of his perc drain.      LOS: 5 days   Ellison Hughs, MD [facility] Pager: [phone]  07/31/2020, 11:07 AM

## 2020-08-01 DIAGNOSIS — R935 Abnormal findings on diagnostic imaging of other abdominal regions, including retroperitoneum: Secondary | ICD-10-CM | POA: Diagnosis not present

## 2020-08-01 LAB — GLUCOSE, CAPILLARY
Glucose-Capillary: 98 mg/dL (ref 70–99)
Glucose-Capillary: 159 mg/dL — ABNORMAL HIGH (ref 70–99)

## 2020-08-01 LAB — CBC WITH DIFFERENTIAL/PLATELET
Abs Immature Granulocytes: 0.3 10*3/uL — ABNORMAL HIGH (ref 0.00–0.07)
Basophils Absolute: 0.1 10*3/uL (ref 0.0–0.1)
Basophils Relative: 0 %
Eosinophils Absolute: 0.2 10*3/uL (ref 0.0–0.5)
Eosinophils Relative: 1 %
HCT: 32.9 % — ABNORMAL LOW (ref 39.0–52.0)
Hemoglobin: 10.4 g/dL — ABNORMAL LOW (ref 13.0–17.0)
Immature Granulocytes: 2 %
Lymphocytes Relative: 9 %
Lymphs Abs: 1.3 10*3/uL (ref 0.7–4.0)
MCH: 28 pg (ref 26.0–34.0)
MCHC: 31.6 g/dL (ref 30.0–36.0)
MCV: 88.4 fL (ref 80.0–100.0)
Monocytes Absolute: 0.9 10*3/uL (ref 0.1–1.0)
Monocytes Relative: 6 %
Neutro Abs: 11.8 10*3/uL — ABNORMAL HIGH (ref 1.7–7.7)
Neutrophils Relative %: 82 %
Platelets: 489 10*3/uL — ABNORMAL HIGH (ref 150–400)
RBC: 3.72 MIL/uL — ABNORMAL LOW (ref 4.22–5.81)
RDW: 14.1 % (ref 11.5–15.5)
WBC: 14.4 10*3/uL — ABNORMAL HIGH (ref 4.0–10.5)
nRBC: 0 % (ref 0.0–0.2)

## 2020-08-01 LAB — AEROBIC/ANAEROBIC CULTURE W GRAM STAIN (SURGICAL/DEEP WOUND)

## 2020-08-01 LAB — CULTURE, BLOOD (ROUTINE X 2)
Culture: NO GROWTH
Culture: NO GROWTH
Special Requests: ADEQUATE
Special Requests: ADEQUATE

## 2020-08-01 LAB — BASIC METABOLIC PANEL
Anion gap: 9 (ref 5–15)
BUN: 13 mg/dL (ref 8–23)
CO2: 24 mmol/L (ref 22–32)
Calcium: 7.9 mg/dL — ABNORMAL LOW (ref 8.9–10.3)
Chloride: 100 mmol/L (ref 98–111)
Creatinine, Ser: 0.56 mg/dL — ABNORMAL LOW (ref 0.61–1.24)
GFR, Estimated: >60 mL/min (ref >60)
Glucose, Bld: 117 mg/dL — ABNORMAL HIGH (ref 70–99)
Potassium: 3.6 mmol/L (ref 3.5–5.1)
Sodium: 133 mmol/L — ABNORMAL LOW (ref 135–145)

## 2020-08-01 MED ORDER — AMOXICILLIN-POT CLAVULANATE 875-125 MG PO TABS: 1.0000 | ORAL_TABLET | Freq: Two times a day (BID) | ORAL | 0 refills | Status: AC

## 2020-08-01 MED ORDER — BLOOD GLUCOSE METER KIT: PACK | 1 refills | Status: AC

## 2020-08-01 MED ORDER — LANTUS SOLOSTAR 100 UNIT/ML ~~LOC~~ SOPN: 40.0000 [IU] | PEN_INJECTOR | Freq: Every day | SUBCUTANEOUS | 1 refills | Status: AC

## 2020-08-01 MED ORDER — INSULIN PEN NEEDLE 32G X 4 MM MISC: 1.0000 | Freq: Three times a day (TID) | 2 refills | Status: AC

## 2020-08-01 MED ORDER — INSULIN GLARGINE 100 UNIT/ML ~~LOC~~ SOLN
40.0000 [IU] | Freq: Every day | SUBCUTANEOUS | Status: DC
  Administered 2020-08-01: 40 [IU] via SUBCUTANEOUS
  Filled 2020-08-01: qty 0.4

## 2020-08-01 MED ORDER — CYCLOBENZAPRINE HCL 10 MG PO TABS: 10.0000 mg | ORAL_TABLET | Freq: Three times a day (TID) | ORAL | 0 refills | Status: DC | PRN

## 2020-08-01 MED ORDER — NOVOLOG FLEXPEN 100 UNIT/ML ~~LOC~~ SOPN: 8.0000 [IU] | PEN_INJECTOR | Freq: Three times a day (TID) | SUBCUTANEOUS | 0 refills | Status: AC

## 2020-08-01 MED ORDER — FERROUS SULFATE 325 (65 FE) MG PO TABS: 325.0000 mg | ORAL_TABLET | Freq: Every day | ORAL | 2 refills | Status: AC

## 2020-08-01 MED ORDER — OXYCODONE HCL 10 MG PO TABS: 10.0000 mg | ORAL_TABLET | Freq: Four times a day (QID) | ORAL | 0 refills | Status: DC | PRN

## 2020-08-01 MED ORDER — POLYETHYLENE GLYCOL 3350 17 G PO PACK
17.0000 g | PACK | Freq: Every day | ORAL | 1 refills | Status: DC | PRN
Start: 2020-08-01 — End: 2020-10-22

## 2020-08-01 MED ORDER — IBUPROFEN 800 MG PO TABS: 800.0000 mg | ORAL_TABLET | Freq: Three times a day (TID) | ORAL | 0 refills | Status: AC | PRN

## 2020-08-01 NOTE — Discharge Summary (Signed)
Physician Discharge Summary  Tasheem Elms MOQ:[id number] DOB: [birth year] DOA: 07/26/2020  PCP: [facility]  Admit date: 07/26/2020 Discharge date: 08/01/2020  Admitted From: Home Disposition:  Home  Discharge Condition:Stable CODE STATUS:FULL Diet recommendation:  Carb Modified  Brief/Interim Summary:  Patient is a 64 year old male with no significant past medical history with who was recently placed on steroids for low back pain/sciatica who presented with complaints of abdominal pain on the right side.  He  also felt that there is some swelling on the right lower quadrant.  On presentation he was febrile, had leukocytosis, elevated blood glucose.  CT abdomen/pelvis was concerning for enhancing collection in the right pararenal space concerning for retroperitoneal abscess.  Urology/IR consulted.  Started on empiric antibiotics.  He underwent IR guided retroperitoneal abscess drain catheter on 10/28/19.  Abscess culture, urine cultures showed MSSA.  Currently he is hemodynamically stable.  Antibiotics changed to oral on discharge.  He will follow-up with IR and urology as an outpatient.  Following problems were addressed during his hospitalization:   Retroperitoneal abscess involving the right pararenal space/renal abscess:There was also suspicion for  renal mass but given his significant leukocytosis, fever, abscess was  favored. Urology consulted and following.  IR also consulted he underwent retroperitoneal abscess drain catheter on 07/27/2020 and there was frank drainage of abscess of the drain.  Urine culture, wound culture showing MSSA.  Most likely the source is urine.Blood cultures negative so far.Currently he is afebrile, leukocytosis is improving. His abdominal pain, back pain has improved . CT showed decease in size of the last abscess, but showed another complex fluid collection is seen in the lateral portion of the right retroperitoneal region which  extends down to the right lower quadrant and right inguinal region. Fluid was seen extending into the visualized portion of the upper right scrotum. IR said that the fluid collections might have connections,no need to put another drain.  He will follow-up with IR as an outpatient. Changed antibiotics to Augmentin.  We will continue antibiotics for total 2 weeks course.  Hyperglycemia/uncontrolled diabetes mellitus type 2: Has severe undiagnosed/uncontrolled diabetes mellitus.  Hemoglobin A1c in the range of 14 .   Diabetic coordinator consulted.  He was sent with insulin on discharge.  Scrotal edema:   Ultrasound of the scrotum did not show testicular torsion/abscess.  This is secondary to extravasation of retroperitoneal collection/abscess into the right scrotum through inguinal canal.  He will follow-up with urology as an outpatient.  Iron deficiency normocytic anemia: Iron panel showed iron deficiency.  Given a 1 dose of IV iron. Added iron supplements on DC  Low back pain: Was on steroids for sciatica at home.  Complains of severe back pain with right lower extremity numbness/tingling.  Continue pain medications,muscle  relaxants.PT consulted,no follow up recommended  Hypokalemia: Supplemented with potassium  Hyponatremia: Sodium stable now   Discharge Diagnoses:  Principal Problem:   Abnormal CT of the abdomen Active Problems:   Flank pain    Discharge Instructions  Discharge Instructions    Ambulatory referral to Nutrition and Diabetic Education   Complete by: As directed    Diet Carb Modified   Complete by: As directed    Discharge instructions   Complete by: As directed    1)Please take your medications as instructed 2)Monitor your blood sugars at home 3)Follow up with interventional radiology and urology as an outpatient 4)Follow up with your PCP in a week.  Do a CBC, BMP test during the follow-up.  Increase activity slowly   Complete by: As directed    No  wound care   Complete by: As directed    No wound care   Complete by: As directed      Allergies as of 08/01/2020   No Known Allergies     Medication List    STOP taking these medications   aspirin 325 MG tablet   azithromycin 250 MG tablet Commonly known as: ZITHROMAX   diclofenac 75 MG EC tablet Commonly known as: VOLTAREN   predniSONE 10 MG (21) Tbpk tablet Commonly known as: STERAPRED UNI-PAK 21 TAB     TAKE these medications   amoxicillin-clavulanate 875-125 MG tablet Commonly known as: AUGMENTIN Take 1 tablet by mouth every 12 (twelve) hours for 8 days.   blood glucose meter kit and supplies Dispense based on patient and insurance preference. Use up to four times daily as directed. (FOR ICD-10 E10.9, E11.9).   cetirizine 10 MG tablet Commonly known as: ZYRTEC Take 10 mg by mouth daily.   cyclobenzaprine 10 MG tablet Commonly known as: FLEXERIL Take 1 tablet (10 mg total) by mouth 3 (three) times daily as needed for muscle spasms. What changed: when to take this   ferrous sulfate 325 (65 FE) MG tablet Take 1 tablet (325 mg total) by mouth daily.   fluticasone 50 MCG/ACT nasal spray Commonly known as: FLONASE Place into both nostrils daily.   ibuprofen 800 MG tablet Commonly known as: ADVIL Take 1 tablet (800 mg total) by mouth every 8 (eight) hours as needed.   Insulin Pen Needle 32G X 4 MM Misc 1 each by Does not apply route 3 (three) times daily.   Lantus SoloStar 100 UNIT/ML Solostar Pen Generic drug: insulin glargine Inject 40 Units into the skin daily.   NovoLOG FlexPen 100 UNIT/ML FlexPen Generic drug: insulin aspart Inject 8 Units into the skin 3 (three) times daily with meals.   Oxycodone HCl 10 MG Tabs Take 1 tablet (10 mg total) by mouth every 6 (six) hours as needed for moderate pain (if ibuprofen not effective).   polyethylene glycol 17 g packet Commonly known as: MIRALAX / GLYCOLAX Take 17 g by mouth daily as needed.        Follow-up Information    Ceasar Mons, MD In 3 weeks.   Specialty: Urology Why: My office will call to schedule an appointment  Contact information: [address] [employer]  [address] [place].   Why: Outpatient follow up in 10-14 days. A scheduler from our office will call you with a date/time. Please continue to flush drain daily and record output. Call our office with any questions prior to your appointment.  Contact information: [place] [address] [phone]        [facility]. Schedule an appointment as soon as possible for a visit in 1 week(s).   Specialty: Family Medicine             No Known Allergies  Consultations:  Urology,IR   Procedures/Studies: CT ABDOMEN PELVIS W WO CONTRAST  Result Date: 07/27/2020 CLINICAL DATA:  Perinephric abscess. EXAM: CT ABDOMEN AND PELVIS WITHOUT AND WITH CONTRAST TECHNIQUE: Multidetector CT imaging of the abdomen and pelvis was performed following the standard protocol before and following the bolus administration of intravenous contrast. The patient was scanned prone due to subsequent interventional procedure. CONTRAST:  1107m OMNIPAQUE IOHEXOL  300 MG/ML  SOLN COMPARISON:  the previous day's study FINDINGS: Lower chest: No acute abnormality. Hepatobiliary: Persistent ill-defined low-attenuation region posteriorly in hepatic segment 4 B suggesting focal fatty infiltration, approximately 3 x 2 cm. No other liver lesion or biliary ductal dilatation. Gallbladder unremarkable. Pancreas: Unremarkable. No pancreatic ductal dilatation or surrounding inflammatory changes. Spleen: Normal in size without focal abnormality. Adrenals/Urinary Tract: Normal adrenals. 4.7 cm simple cyst, exophytic from upper pole left kidney. Scattered peripheral wedge-shaped hyperdense regions in both kidneys on the precontrast study, more  numerous right than left, possibly related to previous day's contrast administration. There is a wedge-shaped area of hypoperfusion in the lower pole right kidney contiguous with the perinephric and posterior retroperitoneal complex process. The process shows peripheral enhancement and loculated central low attenuation, abuts the psoas, and extends inferiorly through the retroperitoneum into the pelvis as before. No other perfusion defects in the kidneys are identified. There is no hydronephrosis. No urolithiasis. Urinary bladder physiologically distended. Stomach/Bowel: Stomach decompressed. Small bowel is nondistended. Normal appendix. Colon is nondilated, unremarkable. Vascular/Lymphatic: Mild atheromatous plaque in the infrarenal aorta. No aneurysm, dissection, or stenosis. Reproductive: Prostate is unremarkable. Other: No ascites.  No free air. Musculoskeletal: No acute or significant osseous findings. IMPRESSION: 1. No significant change in wedge-shaped area of hypoperfusion in the lower pole right kidney contiguous with the perinephric and posterior retroperitoneal complex process. Favor infectious/inflammatory process over neoplasm. Follow-up recommended to confirm appropriate resolution. 2. Scattered wedge-shaped hyperdense regions in both kidneys on the precontrast study, more numerous right than left, possibly related to previous day's contrast administration. Correlate with any clinical or laboratory evidence of pyelonephritis. 3. Persistent ill-defined low-attenuation region posteriorly in hepatic segment 4 B, suggesting focal fatty infiltration. Consider elective outpatient liver MR with contrast for more definitive characterization. Aortic Atherosclerosis (ICD10-I70.0). Electronically Signed   By: Lucrezia Europe M.D.   On: 07/27/2020 13:00   DG Chest 2 View  Result Date: 07/13/2020 CLINICAL DATA:  Productive cough. EXAM: CHEST - 2 VIEW COMPARISON:  None. FINDINGS: The heart size and mediastinal  contours are within normal limits. Both lungs are clear. The visualized skeletal structures are unremarkable. IMPRESSION: No active cardiopulmonary disease. Electronically Signed   By: Lovey Newcomer M.D.   On: 07/13/2020 15:27   CT ABDOMEN PELVIS W CONTRAST  Result Date: 07/29/2020 CLINICAL DATA:  Retroperitoneal abscess. EXAM: CT ABDOMEN AND PELVIS WITH CONTRAST TECHNIQUE: Multidetector CT imaging of the abdomen and pelvis was performed using the standard protocol following bolus administration of intravenous contrast. CONTRAST:  14m OMNIPAQUE IOHEXOL 300 MG/ML  SOLN COMPARISON:  July 27, 2020. FINDINGS: Lower chest: Minimal right pleural effusion is noted with adjacent subsegmental atelectasis. Hepatobiliary: No focal liver abnormality is seen. No gallstones, gallbladder wall thickening, or biliary dilatation. Pancreas: Unremarkable. No pancreatic ductal dilatation or surrounding inflammatory changes. Spleen: Normal in size without focal abnormality. Adrenals/Urinary Tract: Adrenal glands are unremarkable. Stable left renal cyst is noted. No hydronephrosis or renal obstruction is noted. No renal or ureteral calculi are noted. Urinary bladder is unremarkable. There is been interval placement of percutaneous drainage catheter into probable right retroperitoneal abscess which is adjacent to lower pole of right kidney. The abscess appears to be significantly smaller compared to prior exam, although significant solid density remains suggesting some degree of phlegmon. A nother complex fluid collection is seen in the lateral portion of the right retroperitoneal region which extends down to the right lower quadrant and right inguinal region. Fluid is seen extending into the visualized portion  of the upper right scrotum. Stomach/Bowel: Stomach is within normal limits. Appendix appears normal. No evidence of bowel wall thickening, distention, or inflammatory changes. Vascular/Lymphatic: No significant vascular  findings are present. No enlarged abdominal or pelvic lymph nodes. Reproductive: Prostate is unremarkable. Other: No definite ascites is noted. Musculoskeletal: No acute or significant osseous findings. IMPRESSION: 1. Interval placement of percutaneous drainage catheter into probable right retroperitoneal abscess which is significantly smaller compared to prior exam, although significant solid density remains suggesting some degree of phlegmon. Another complex fluid collection is seen in the lateral portion of the right retroperitoneal region which extends down to the right lower quadrant and right inguinal region. Fluid is seen extending into the visualized portion of the upper right scrotum. 2. Minimal right pleural effusion is noted with adjacent subsegmental atelectasis. Electronically Signed   By: Marijo Conception M.D.   On: 07/29/2020 11:34   CT Abdomen Pelvis W Contrast  Result Date: 07/26/2020 CLINICAL DATA:  Right lower quadrant abdominal pain EXAM: CT ABDOMEN AND PELVIS WITH CONTRAST TECHNIQUE: Multidetector CT imaging of the abdomen and pelvis was performed using the standard protocol following bolus administration of intravenous contrast. CONTRAST:  162m OMNIPAQUE IOHEXOL 300 MG/ML  SOLN COMPARISON:  None. FINDINGS: Lower chest: Visualized lung bases are clear. Visualized heart and pericardium are unremarkable peer Hepatobiliary: Mild hepatomegaly. No focal liver lesion. No intra or extrahepatic biliary ductal dilation. Gallbladder unremarkable. Pancreas: Unremarkable. Spleen: Moderate splenomegaly with the spleen measuring 17 cm in greatest dimension. No intrasplenic lesion identified. The splenic vein is patent. Adrenals/Urinary Tract: The adrenal glands are unremarkable. There is a heterogeneously enhancing collection seen within the posterior right pararenal space intimately associated with the posterior, inferior pole of the left kidney which demonstrates hypoenhancement on delayed images. This  measures 9.2 x 5.0 cm on axial image # 38. There is no extravasation of contrast noted on delayed images. Together, the findings may reflect focal pyelonephritis with retroperitoneal abscess formation, retroperitoneal hemorrhage secondary to an underlying renal mass, or a retroperitoneal urinoma, though the latter is considered less likely given the lack of contrast extravasation. There is extension of this inflammatory or hemorrhagic process into the retroperitoneal space lateral to the right kidney and subjacent to the transversus abdominus musculature. Hypoenhancement of the lower pole of the right kidney is noted on delayed images. The kidneys are otherwise normal in size and position and demonstrate normal cortical enhancement. Simple cortical cyst within the left kidney. No hydronephrosis. No intrarenal or ureteral calculi. Bladder unremarkable. Stomach/Bowel: The stomach, small bowel, and large bowel are unremarkable. Appendix normal. No free intraperitoneal gas or fluid. Vascular/Lymphatic: Shotty pericaval adenopathy is present, likely reactive. No frankly pathologic adenopathy within the abdomen and pelvis. The abdominal vasculature is unremarkable. Reproductive: Prostate is unremarkable. Other: Rectum unremarkable. Musculoskeletal: No acute bone abnormality. IMPRESSION: Irregularly enhancing collection within the right posterior pararenal space intimately associated with the posteroinferior pole of the right kidney extending into the right psoas and subsequently into the retroperitoneal space subjacent to the right transverse abdominus musculature. Differential considerations include hemorrhage related to an underlying renal mass, a retroperitoneal abscess related to focal pyonephrosis, or a chronic urinoma though no definite leakage of urine is seen at this time on delayed images. There is associated hypoenhancement of a the adjacent renal cortex again favoring a renal etiology. Among the differential  considerations, retroperitoneal abscess is considered most likely. Exclusion of an underlying mass could be performed with dedicated renal mass protocol CT or MRI imaging. Electronically Signed  By: Fidela Salisbury MD   On: 07/26/2020 16:01   US SCROTUM W/DOPPLER  Result Date: 07/28/2020 CLINICAL DATA:  Scrotal sac enlargement EXAM: SCROTAL ULTRASOUND DOPPLER ULTRASOUND OF THE TESTICLES TECHNIQUE: Complete ultrasound examination of the testicles, epididymis, and other scrotal structures was performed. Color and spectral Doppler ultrasound were also utilized to evaluate blood flow to the testicles. COMPARISON:  CT abdomen pelvis 07/27/2020 FINDINGS: Right testicle Measurements: 4.8 x 2.9 x 3.3 cm. No mass or microlithiasis visualized. Left testicle Measurements: 4.8 x 2.0 x 3.0 cm. No mass or microlithiasis visualized. Right epididymis:  Normal in size and appearance. Left epididymis:  Normal in size and appearance. Hydrocele:  Small bilateral hydroceles. Varicocele:  Bilateral varicoceles visualized. Pulsed Doppler interrogation of both testes demonstrates normal low resistance arterial and venous waveforms bilaterally. Bilateral scrotal wall thickening. There is complex fluid distending within the right inguinal canal with low level internal echoes extending from the region of the pelvis to the scrotal sac maximal dimensions measuring 2.6 x 3.6 cm trans axially and extending up to approximately 19 cm in length. IMPRESSION: 1. Negative for testicular torsion or intratesticular mass. 2. Bilateral scrotal wall thickening. No evidence of scrotal wall fluid collection or abscess. 3. There is complex fluid tracking within the right inguinal canal extending from the pelvis to the right hemiscrotum, similar in appearance to the previous CT abdomen pelvis 07/27/2020. 4. Small bilateral hydroceles. 5. Bilateral varicoceles. Electronically Signed   By: Davina Poke D.O.   On: 07/28/2020 15:24   CT IMAGE GUIDED  DRAINAGE BY PERCUTANEOUS CATHETER  Result Date: 07/27/2020 CLINICAL DATA:  Retroperitoneal abscess EXAM: CT GUIDED DRAINAGE OF RETROPERITONEAL ABSCESS ANESTHESIA/SEDATION: Intravenous Fentanyl 77mg and Versed 1.549mwere administered as conscious sedation during continuous monitoring of the patient's level of consciousness and physiological / cardiorespiratory status by the radiology RN, with a total moderate sedation time of 14 minutes. PROCEDURE: The procedure, risks, benefits, and alternatives were explained to the patient. Questions regarding the procedure were encouraged and answered. The patient understands and consents to the procedure. Patient had previously been placed prone for diagnostic CT, reported separately. The collection was localized and an appropriate skin entry site was determined and marked. The operative field was prepped with chlorhexidinein a sterile fashion, and a sterile drape was applied covering the operative field. A sterile gown and sterile gloves were used for the procedure. Local anesthesia was provided with 1% Lidocaine. Under CT fluoroscopic guidance, 18 gauge trocar needle advanced in the collection. Purulent material could be aspirated. Amplatz wire advanced easily within the collection, confirmed on CT fluoro. Tract dilated to facilitate placement of a 12 French pigtail drain catheter, formed centrally within the collection. 30 mL of purulent material were aspirated, sent for Gram stain and culture. Catheter secured externally with 0 Prolene suture and StatLock and placed to gravity drain bag. The patient tolerated the procedure well. COMPLICATIONS: None immediate FINDINGS: Complex loculated right retroperitoneal fluid collection was localized. 12 French pigtail drain catheter placed as above. 30 mL purulent aspirate sent for Gram stain and culture. IMPRESSION: Technically successful CT-guided right retroperitoneal abscess drain catheter placement. Electronically Signed   By:  D Lucrezia Europe.D.   On: 07/27/2020 13:02      Subjective: Patient seen and examined at the bedside this morning.  Medically stable for discharge today.  Discharge Exam: Vitals:   07/31/20 2142 08/01/20 0535  BP: 132/64 137/77  Pulse: 80 80  Resp: 18 16  Temp: 98.4 F (36.9 C) 98.6 F (  37 C)  SpO2: 100% 95%   Vitals:   07/31/20 1504 07/31/20 1505 07/31/20 2142 08/01/20 0535  BP: (!) 147/76 (!) 147/76 132/64 137/77  Pulse: 83 83 80 80  Resp: (!) 21 (!) 21 18 16   Temp: 98.6 F (37 C) 98.6 F (37 C) 98.4 F (36.9 C) 98.6 F (37 C)  TempSrc: Oral Oral Oral Oral  SpO2:  92% 100% 95%  Weight:      Height:        General: Pt is alert, awake, not in acute distress Cardiovascular: RRR, S1/S2 +, no rubs, no gallops Respiratory: CTA bilaterally, no wheezing, no rhonchi Abdominal: Soft, NT, ND, bowel sounds +,drain Extremities: no edema, no cyanosis    The results of significant diagnostics from this hospitalization (including imaging, microbiology, ancillary and laboratory) are listed below for reference.     Microbiology: Recent Results (from the past 240 hour(s))  Urine culture     Status: Abnormal   Collection Time: 07/26/20 12:44 PM   Specimen: Urine, Random  Result Value Ref Range Status   Specimen Description   Final    URINE, RANDOM Performed at [facility], [address] [place], [place] [address]    Special Requests   Final    NONE Performed at [facility], [address] [place], [place] [address]    Culture 60,000 COLONIES/mL STAPHYLOCOCCUS AUREUS (A)  Final   Report Status 07/29/2020 FINAL  Final   Organism ID, Bacteria STAPHYLOCOCCUS AUREUS (A)  Final      Susceptibility   Staphylococcus aureus - MIC*    CIPROFLOXACIN >=8 RESISTANT Resistant     GENTAMICIN <=0.5 SENSITIVE Sensitive     NITROFURANTOIN <=16 SENSITIVE Sensitive     OXACILLIN <=0.25 SENSITIVE Sensitive     TETRACYCLINE <=1 SENSITIVE Sensitive     VANCOMYCIN 1  SENSITIVE Sensitive     TRIMETH/SULFA <=10 SENSITIVE Sensitive     CLINDAMYCIN <=0.25 SENSITIVE Sensitive     RIFAMPIN <=0.5 SENSITIVE Sensitive     Inducible Clindamycin NEGATIVE Sensitive     * 60,000 COLONIES/mL STAPHYLOCOCCUS AUREUS  Respiratory Panel by RT PCR (Flu A&B, Covid) - Nasopharyngeal Swab     Status: None   Collection Time: 07/26/20  4:45 PM   Specimen: Nasopharyngeal Swab  Result Value Ref Range Status   SARS Coronavirus 2 by RT PCR NEGATIVE NEGATIVE Final    Comment: (NOTE) SARS-CoV-2 target nucleic acids are NOT DETECTED.  The SARS-CoV-2 RNA is generally detectable in upper respiratoy specimens during the acute phase of infection. The lowest concentration of SARS-CoV-2 viral copies this assay can detect is 131 copies/mL. A negative result does not preclude SARS-Cov-2 infection and should not be used as the sole basis for treatment or other patient management decisions. A negative result may occur with  improper specimen collection/handling, submission of specimen other than nasopharyngeal swab, presence of viral mutation(s) within the areas targeted by this assay, and inadequate number of viral copies (<131 copies/mL). A negative result must be combined with clinical observations, patient history, and epidemiological information. The expected result is Negative.  Fact Sheet for Patients:  PinkCheek.be  Fact Sheet for Healthcare Providers:  GravelBags.it  This test is no t yet approved or cleared by the [place] FDA and  has been authorized for detection and/or diagnosis of SARS-CoV-2 by FDA under an Emergency Use Authorization (EUA). This EUA will remain  in effect (meaning this test can be used) for the duration  of the COVID-19 declaration under Section 564(b)(1) of the Act, 21 U.S.C. section 360bbb-3(b)(1), unless the authorization is terminated or revoked sooner.     Influenza A by PCR  NEGATIVE NEGATIVE Final   Influenza B by PCR NEGATIVE NEGATIVE Final    Comment: (NOTE) The Xpert Xpress SARS-CoV-2/FLU/RSV assay is intended as an aid in  the diagnosis of influenza from Nasopharyngeal swab specimens and  should not be used as a sole basis for treatment. Nasal washings and  aspirates are unacceptable for Xpert Xpress SARS-CoV-2/FLU/RSV  testing.  Fact Sheet for Patients: PinkCheek.be  Fact Sheet for Healthcare Providers: GravelBags.it  This test is not yet approved or cleared by the [place] FDA and  has been authorized for detection and/or diagnosis of SARS-CoV-2 by  FDA under an Emergency Use Authorization (EUA). This EUA will remain  in effect (meaning this test can be used) for the duration of the  Covid-19 declaration under Section 564(b)(1) of the Act, 21  U.S.C. section 360bbb-3(b)(1), unless the authorization is  terminated or revoked. Performed at [facility], [address] [place], [place] [address]   Culture, blood (routine x 2)     Status: None   Collection Time: 07/26/20  5:55 PM   Specimen: BLOOD RIGHT HAND  Result Value Ref Range Status   Specimen Description   Final    BLOOD RIGHT HAND Performed at [facility], [address] [place], [place] [address]    Special Requests   Final    BOTTLES DRAWN AEROBIC AND ANAEROBIC Blood Culture adequate volume Performed at [facility], [address] [place], [place] [address]    Culture   Final    NO GROWTH 5 DAYS Performed at [facility], [place] [address], [place], [place] [address]    Report Status 08/01/2020 FINAL  Final  Culture, blood (routine x 2)     Status: None   Collection Time: 07/26/20  6:10 PM   Specimen: BLOOD  Result Value Ref Range Status   Specimen Description   Final    BLOOD LEFT ANTECUBITAL Performed at [facility], [place] [address], [place], [place] [address]     Special Requests   Final    BOTTLES DRAWN AEROBIC AND ANAEROBIC Blood Culture adequate volume Performed at [facility], [address] [place], [place] [address]    Culture   Final    NO GROWTH 5 DAYS Performed at [facility], [place] [address], [place], [place] [address]    Report Status 08/01/2020 FINAL  Final  Aerobic/Anaerobic Culture (surgical/deep wound)     Status: None (Preliminary result)   Collection Time: 07/27/20 10:42 AM   Specimen: Abscess  Result Value Ref Range Status   Specimen Description   Final    ABSCESS Performed at [place] [address], [place], [place] [address]    Special Requests   Final    NONE Performed at [facility], [place] [address] [place], [place] [address]    Gram Stain   Final    ABUNDANT WBC PRESENT, PREDOMINANTLY PMN ABUNDANT GRAM POSITIVE COCCI IN PAIRS IN CLUSTERS Performed at [facility], [place] [address], [place], [place] [address]    Culture   Final    ABUNDANT STAPHYLOCOCCUS AUREUS NO ANAEROBES ISOLATED; CULTURE IN PROGRESS FOR 5 DAYS    Report Status PENDING  Incomplete   Organism  ID, Bacteria STAPHYLOCOCCUS AUREUS  Final      Susceptibility   Staphylococcus aureus - MIC*    CIPROFLOXACIN >=8 RESISTANT Resistant     ERYTHROMYCIN <=0.25 SENSITIVE Sensitive     GENTAMICIN <=0.5 SENSITIVE Sensitive     OXACILLIN <=0.25 SENSITIVE Sensitive     TETRACYCLINE <=1 SENSITIVE Sensitive     VANCOMYCIN 1 SENSITIVE Sensitive     TRIMETH/SULFA <=10 SENSITIVE Sensitive     CLINDAMYCIN <=0.25 SENSITIVE Sensitive     RIFAMPIN <=0.5 SENSITIVE Sensitive     Inducible Clindamycin NEGATIVE Sensitive     * ABUNDANT STAPHYLOCOCCUS AUREUS     Labs: BNP (last 3 results) No results for input(s): BNP in the last 8760 hours. Basic Metabolic Panel: Recent Labs  Lab 07/28/20 0557 07/29/20 0535 07/30/20 0531 07/31/20 0451 08/01/20 0418  NA 129* 132* 133* 134* 133*  K 3.3* 3.3*  3.4* 3.8 3.6  CL 94* 96* 98 100 100  CO2 22 25 25 25 24   GLUCOSE 314* 251* 220* 175* 117*  BUN 12 12 13 16 13   CREATININE 0.69 0.56* 0.54* 0.62 0.56*  CALCIUM 8.0* 8.1* 8.0* 8.1* 7.9*  MG 1.8  --   --   --   --    Liver Function Tests: Recent Labs  Lab 07/26/20 1243 07/27/20 0612  AST 12* 9*  ALT 13 11  ALKPHOS 122 127*  BILITOT 0.7 1.4*  PROT 7.2 7.0  ALBUMIN 2.3* 2.4*   Recent Labs  Lab 07/26/20 1243  LIPASE 18   No results for input(s): AMMONIA in the last 168 hours. CBC: Recent Labs  Lab 07/28/20 0557 07/29/20 0535 07/30/20 0531 07/31/20 0451 08/01/20 0418  WBC 23.2* 22.2* 17.1* 14.7* 14.4*  NEUTROABS 20.7* 19.6* 14.5* 11.6* 11.8*  HGB 10.7* 11.2* 11.0* 11.2* 10.4*  HCT 34.1* 33.9* 33.7* 35.1* 32.9*  MCV 87.9 84.8 86.2 87.1 88.4  PLT 484* 459* 454* 510* 489*   Cardiac Enzymes: No results for input(s): CKTOTAL, CKMB, CKMBINDEX, TROPONINI in the last 168 hours. BNP: Invalid input(s): POCBNP CBG: Recent Labs  Lab 07/31/20 0734 07/31/20 1159 07/31/20 1653 07/31/20 2137 08/01/20 0734  GLUCAP 171* 170* 197* 126* 98   D-Dimer No results for input(s): DDIMER in the last 72 hours. Hgb A1c No results for input(s): HGBA1C in the last 72 hours. Lipid Profile No results for input(s): CHOL, HDL, LDLCALC, TRIG, CHOLHDL, LDLDIRECT in the last 72 hours. Thyroid function studies No results for input(s): TSH, T4TOTAL, T3FREE, THYROIDAB in the last 72 hours.  Invalid input(s): FREET3 Anemia work up No results for input(s): VITAMINB12, FOLATE, FERRITIN, TIBC, IRON, RETICCTPCT in the last 72 hours. Urinalysis    Component Value Date/Time   COLORURINE YELLOW 07/26/2020 [place] 07/26/2020 1244   LABSPEC 1.015 07/26/2020 1244   PHURINE 6.0 07/26/2020 1244   GLUCOSEU >=500 (A) 07/26/2020 1244   HGBUR SMALL (A) 07/26/2020 1244   HGBUR trace-intact 09/08/2010 0826   BILIRUBINUR NEGATIVE 07/26/2020 1244   BILIRUBINUR n 05/12/2012 0950    KETONESUR >80 (A) 07/26/2020 1244   PROTEINUR 30 (A) 07/26/2020 1244   UROBILINOGEN 0.2 05/12/2012 0950   UROBILINOGEN 0.2 09/08/2010 0826   NITRITE NEGATIVE 07/26/2020 1244   LEUKOCYTESUR NEGATIVE 07/26/2020 1244   Sepsis Labs Invalid input(s): PROCALCITONIN,  WBC,  LACTICIDVEN Microbiology Recent Results (from the past 240 hour(s))  Urine culture     Status: Abnormal   Collection Time: 07/26/20 12:44 PM   Specimen: Urine, Random  Result Value Ref Range  Status   Specimen Description   Final    URINE, RANDOM Performed at [facility], [address] [place], [place] [address]    Special Requests   Final    NONE Performed at [facility], [address] [place], [place] [address]    Culture 60,000 COLONIES/mL STAPHYLOCOCCUS AUREUS (A)  Final   Report Status 07/29/2020 FINAL  Final   Organism ID, Bacteria STAPHYLOCOCCUS AUREUS (A)  Final      Susceptibility   Staphylococcus aureus - MIC*    CIPROFLOXACIN >=8 RESISTANT Resistant     GENTAMICIN <=0.5 SENSITIVE Sensitive     NITROFURANTOIN <=16 SENSITIVE Sensitive     OXACILLIN <=0.25 SENSITIVE Sensitive     TETRACYCLINE <=1 SENSITIVE Sensitive     VANCOMYCIN 1 SENSITIVE Sensitive     TRIMETH/SULFA <=10 SENSITIVE Sensitive     CLINDAMYCIN <=0.25 SENSITIVE Sensitive     RIFAMPIN <=0.5 SENSITIVE Sensitive     Inducible Clindamycin NEGATIVE Sensitive     * 60,000 COLONIES/mL STAPHYLOCOCCUS AUREUS  Respiratory Panel by RT PCR (Flu A&B, Covid) - Nasopharyngeal Swab     Status: None   Collection Time: 07/26/20  4:45 PM   Specimen: Nasopharyngeal Swab  Result Value Ref Range Status   SARS Coronavirus 2 by RT PCR NEGATIVE NEGATIVE Final    Comment: (NOTE) SARS-CoV-2 target nucleic acids are NOT DETECTED.  The SARS-CoV-2 RNA is generally detectable in upper respiratoy specimens during the acute phase of infection. The lowest concentration of SARS-CoV-2 viral copies this assay can detect is 131 copies/mL. A  negative result does not preclude SARS-Cov-2 infection and should not be used as the sole basis for treatment or other patient management decisions. A negative result may occur with  improper specimen collection/handling, submission of specimen other than nasopharyngeal swab, presence of viral mutation(s) within the areas targeted by this assay, and inadequate number of viral copies (<131 copies/mL). A negative result must be combined with clinical observations, patient history, and epidemiological information. The expected result is Negative.  Fact Sheet for Patients:  PinkCheek.be  Fact Sheet for Healthcare Providers:  GravelBags.it  This test is no t yet approved or cleared by the [place] FDA and  has been authorized for detection and/or diagnosis of SARS-CoV-2 by FDA under an Emergency Use Authorization (EUA). This EUA will remain  in effect (meaning this test can be used) for the duration of the COVID-19 declaration under Section 564(b)(1) of the Act, 21 U.S.C. section 360bbb-3(b)(1), unless the authorization is terminated or revoked sooner.     Influenza A by PCR NEGATIVE NEGATIVE Final   Influenza B by PCR NEGATIVE NEGATIVE Final    Comment: (NOTE) The Xpert Xpress SARS-CoV-2/FLU/RSV assay is intended as an aid in  the diagnosis of influenza from Nasopharyngeal swab specimens and  should not be used as a sole basis for treatment. Nasal washings and  aspirates are unacceptable for Xpert Xpress SARS-CoV-2/FLU/RSV  testing.  Fact Sheet for Patients: PinkCheek.be  Fact Sheet for Healthcare Providers: GravelBags.it  This test is not yet approved or cleared by the [place] FDA and  has been authorized for detection and/or diagnosis of SARS-CoV-2 by  FDA under an Emergency Use Authorization (EUA). This EUA will remain  in effect (meaning this test can  be used) for the duration of the  Covid-19 declaration under Section 564(b)(1) of the Act, 21  U.S.C. section 360bbb-3(b)(1), unless the authorization is  terminated or revoked. Performed at  [facility], [address] [place], [place] [address]   Culture, blood (routine x 2)     Status: None   Collection Time: 07/26/20  5:55 PM   Specimen: BLOOD RIGHT HAND  Result Value Ref Range Status   Specimen Description   Final    BLOOD RIGHT HAND Performed at [facility], [address] [place], [place] [address]    Special Requests   Final    BOTTLES DRAWN AEROBIC AND ANAEROBIC Blood Culture adequate volume Performed at [facility], [address] [place], [place] [address]    Culture   Final    NO GROWTH 5 DAYS Performed at [facility], [place] [address], [place], [place] [address]    Report Status 08/01/2020 FINAL  Final  Culture, blood (routine x 2)     Status: None   Collection Time: 07/26/20  6:10 PM   Specimen: BLOOD  Result Value Ref Range Status   Specimen Description   Final    BLOOD LEFT ANTECUBITAL Performed at [facility], [place] [address] [place], [place] [address]    Special Requests   Final    BOTTLES DRAWN AEROBIC AND ANAEROBIC Blood Culture adequate volume Performed at [facility], [address] [place], [place] [address]    Culture   Final    NO GROWTH 5 DAYS Performed at [facility], [place] [address], [place], [place] [address]    Report Status 08/01/2020 FINAL  Final  Aerobic/Anaerobic Culture (surgical/deep wound)     Status: None (Preliminary result)   Collection Time: 07/27/20 10:42 AM   Specimen: Abscess  Result Value Ref Range Status   Specimen Description   Final    ABSCESS Performed at [place] [address], [place], [place] [address]    Special Requests   Final    NONE Performed at [facility], [place] [address] [place], [place]  [address]    Gram Stain   Final    ABUNDANT WBC PRESENT, PREDOMINANTLY PMN ABUNDANT GRAM POSITIVE COCCI IN PAIRS IN CLUSTERS Performed at [facility], [place] [address] [place], [place] [address]    Culture   Final    ABUNDANT STAPHYLOCOCCUS AUREUS NO ANAEROBES ISOLATED; CULTURE IN PROGRESS FOR 5 DAYS    Report Status PENDING  Incomplete   Organism ID, Bacteria STAPHYLOCOCCUS AUREUS  Final      Susceptibility   Staphylococcus aureus - MIC*    CIPROFLOXACIN >=8 RESISTANT Resistant     ERYTHROMYCIN <=0.25 SENSITIVE Sensitive     GENTAMICIN <=0.5 SENSITIVE Sensitive     OXACILLIN <=0.25 SENSITIVE Sensitive     TETRACYCLINE <=1 SENSITIVE Sensitive     VANCOMYCIN 1 SENSITIVE Sensitive     TRIMETH/SULFA <=10 SENSITIVE Sensitive     CLINDAMYCIN <=0.25 SENSITIVE Sensitive     RIFAMPIN <=0.5 SENSITIVE Sensitive     Inducible Clindamycin NEGATIVE Sensitive     * ABUNDANT STAPHYLOCOCCUS AUREUS    Please note: You were cared for by a hospitalist during your hospital stay. Once you are discharged, your primary care physician will handle any further medical issues. Please note that NO REFILLS for any discharge medications will be authorized once you are discharged, as it is imperative that you return to your primary care physician (or establish a relationship with a primary care physician if you do not have one) for your post hospital  discharge needs so that they can reassess your need for medications and monitor your lab values.    Time coordinating discharge: 40 minutes  SIGNED:   Shelly Coss, MD  [facility] 08/01/2020, 11:37 AM Pager [phone]  If 7PM-7AM, please contact night-coverage [email or web address] Password [email or web address]

## 2020-08-01 NOTE — Progress Notes (Signed)
  Subjective: The patient reports less scrotal/right inguinal pain and swelling this AM.  WBC stable.  Continues to be afebrile.   Objective: Vital signs in last 24 hours: Temp:  [98.4 F (36.9 C)-98.6 F (37 C)] 98.6 F (37 C) (10/28 0535) Pulse Rate:  [80-83] 80 (10/28 0535) Resp:  [16-21] 16 (10/28 0535) BP: (132-147)/(64-77) 137/77 (10/28 0535) SpO2:  [92 %-100 %] 95 % (10/28 0535)  Intake/Output from previous day: 10/27 0701 - 10/28 0700 In: 1870 [P.O.:1860] Out: 2727 [Urine:2701; Drains:25; Stool:1]  Intake/Output this shift: Total I/O In: 245 [P.O.:240; Other:5] Out: 7672 [CNOBS:9628]  Physical Exam:  General: Alert and oriented CV: RRR, palpable distal pulses Lungs: CTAB, equal chest rise Abdomen: Soft, NTND, no rebound or guarding.  Right perc drain in place with purulent output Gu: Less scrotal edema compared to yesterday.  The right testicle and inguinal canal are still indurated, but less so than yesterday.  No signs of scrotal cellulitis or necrosis.  Ext: NT, No erythema  Lab Results: Recent Labs    07/30/20 0531 07/31/20 0451 08/01/20 0418  HGB 11.0* 11.2* 10.4*  HCT 33.7* 35.1* 32.9*   BMET Recent Labs    07/31/20 0451 08/01/20 0418  NA 134* 133*  K 3.8 3.6  CL 100 100  CO2 25 24  GLUCOSE 175* 117*  BUN 16 13  CREATININE 0.62 0.56*  CALCIUM 8.1* 7.9*     Studies/Results: No results found.  Assessment/Plan: 64 year old male with a large right retroperitoneal/perinephricabscess status post percutaneous drain placement on 07/27/2020.  Now with a right inguinal fluid collection extending down into the right hemiscrotum.   -continue ampicillin -IR is planning a drain assessment in 10-14 days as an OP.  I will see him shortly after for a follow-up exam.    LOS: 6 days   Ellison Hughs, MD [facility] Pager: [phone]  08/01/2020, 11:00 AM

## 2020-08-01 NOTE — TOC Initial Note (Signed)
Transition of Care [facility]) - Initial/Assessment Note    Patient Details  Name: Immanuel Fedak MRN: [id number] Date of Birth: 01/07/1956  [facility]) CM/SW Contact:    Qunisha Bryk, Marjie Skiff, RN Phone Number: 08/01/2020, 1:56 PM  Clinical Narrative:                 Pt to dc home with new percutaneous drain. Choice offered for home health services and [facility] chosen. [place] liaison contacted for referral.   Expected Discharge Plan: [place] Barriers to Discharge: No Barriers Identified   Patient Goals and CMS Choice Patient states their goals for this hospitalization and ongoing recovery are:: to get better CMS Medicare.gov Compare Post Acute Care list provided to:: Patient Choice offered to / list presented to : Patient  Expected Discharge Plan and Services Expected Discharge Plan: [place]   Discharge Planning Services: CM Consult Post Acute Care Choice: Home Health   Expected Discharge Date: 08/01/20                         HH Arranged: RN [place] Agency: [place] ([place]) Date HH Agency Contacted: 08/01/20   Representative spoke with at [place]: Clarene Critchley  Prior Living Arrangements/Services   Lives with:: Self Patient language and need for interpreter reviewed:: Yes Do you feel safe going back to the place where you live?: Yes            Criminal Activity/Legal Involvement Pertinent to Current Situation/Hospitalization: No - Comment as needed  Activities of Daily Living Home Assistive Devices/Equipment: None ADL Screening (condition at time of admission) Patient's cognitive ability adequate to safely complete daily activities?: Yes Is the patient deaf or have difficulty hearing?: No Does the patient have difficulty seeing, even when wearing glasses/contacts?: No Does the patient have difficulty concentrating, remembering, or making decisions?: No Patient able to express need for assistance with ADLs?: Yes Does  the patient have difficulty dressing or bathing?: No Independently performs ADLs?: Yes (appropriate for developmental age) Does the patient have difficulty walking or climbing stairs?: No Weakness of Legs: None Weakness of Arms/Hands: None  Permission Sought/Granted   Permission granted to share information with : Yes, Verbal Permission Granted     Permission granted to share info w AGENCY: [facility]        Emotional Assessment              Admission diagnosis:  Abnormal CT of the abdomen [R93.5] Renal abscess, right [N15.1] Flank pain [R10.9] Patient Active Problem List   Diagnosis Date Noted   Abnormal CT of the abdomen 07/26/2020   Flank pain 07/26/2020   ALLERGIC RHINITIS 09/08/2010   OSTEOARTHRITIS 09/08/2010   PCP:  [facility], [place]:   [place], [address] [place] [address] Phone: [phone] Fax: [place], [place] [address] [place] [address] Phone: [phone] Fax: [phone]     Social Determinants of Health (SDOH) Interventions    Readmission Risk Interventions No flowsheet data found.

## 2020-08-01 NOTE — Progress Notes (Signed)
Inpatient Diabetes Program Recommendations  AACE/ADA: New Consensus Statement on Inpatient Glycemic Control (2015)  Target Ranges:  Prepandial:   less than 140 mg/dL      Peak postprandial:   less than 180 mg/dL (1-2 hours)      Critically ill patients:  140 - 180 mg/dL   Lab Results  Component Value Date   GLUCAP 98 08/01/2020   HGBA1C 14.3 (H) 07/28/2020    Review of Glycemic Control  Inpatient Diabetes Program Recommendations:   Discussed with Dr. Tawanna Solo diabetes management and dosing of insulin for home. Spoke with patient via phone and reviewed plan for discharge insulin orders: Lantus 40 units qd and Novolog 8 units tid meal coverage.Reviewed how to take his insulin qd and ac meals for meal coverage, and to check CBGs q 4 hrs . Patient verbalized understanding and no further questions.  Thank you, Nani Gasser. Rivers Gassmann, RN, MSN, CDE  Diabetes Coordinator Inpatient Glycemic Control Team Team Pager [phone] (8am-5pm) 08/01/2020 9:41 AM

## 2020-08-01 NOTE — Progress Notes (Signed)
Physical Therapy Treatment Patient Details Name: Paul Estes MRN: [id number] DOB: 11-12-1955 Today's Date: 08/01/2020    History of Present Illness 64 year old male with a large right retroperitoneal/perinephric abscess status post percutaneous drain placement on 07/27/2020 now with worsening right testicular pain, persistent leukocytosis and markedly elevated blood glucose    PT Comments    Pt performance much better today. He is independent with bed mobility, transfers, and min G for safety with ambulation without AD. His dynamic balance, stability, and endurance has increased, as shown by his ability to ambulate 250ft in the hallway without AD, LOB, or s/o fatigue. Gait sequence is good; equal stride length, B weight shift, and B stance time. Slight decreased gait velocity. Pt claims he walks "slower in the hospital" because he is constantly being told to be careful. No increase in pain during any TA.  Follow Up Recommendations  No PT follow up     Equipment Recommendations  None recommended by PT    Recommendations for Other Services       Precautions / Restrictions Precautions Precautions: Fall Restrictions Weight Bearing Restrictions: No    Mobility  Bed Mobility Overal bed mobility: Independent             General bed mobility comments: Pt performed well with proper technique  Transfers Overall transfer level: Independent Equipment used: None                Ambulation/Gait Ambulation/Gait assistance: Min guard Gait Distance (Feet): 200 Feet Assistive device: None Gait Pattern/deviations: Step-through pattern;Decreased stride length     General Gait Details: Decreased stride length because we are in the hospital and everyone instructs him to be safe, per pt statement. Pt steady with no LOB or need for UE support.   Stairs             Wheelchair Mobility    Modified Rankin (Stroke Patients Only)       Balance                                             Cognition Arousal/Alertness: Awake/alert Behavior During Therapy: WFL for tasks assessed/performed Overall Cognitive Status: Within Functional Limits for tasks assessed                                        Exercises      General Comments        Pertinent Vitals/Pain Pain Assessment: 0-10 Pain Score: 4  Pain Location: drain site Pain Descriptors / Indicators: Sore Pain Intervention(s): Monitored during session    Home Living                      Prior Function            PT Goals (current goals can now be found in the care plan section) Acute Rehab PT Goals Patient Stated Goal: to get rid of this infection PT Goal Formulation: With patient Time For Goal Achievement: 08/13/20 Potential to Achieve Goals: Good    Frequency    Min 3X/week      PT Plan      Co-evaluation              AM-PAC PT "6 Clicks" Mobility   Outcome Measure  Help  needed turning from your back to your side while in a flat bed without using bedrails?: None Help needed moving from lying on your back to sitting on the side of a flat bed without using bedrails?: None Help needed moving to and from a bed to a chair (including a wheelchair)?: None Help needed standing up from a chair using your arms (e.g., wheelchair or bedside chair)?: None Help needed to walk in hospital room?: None Help needed climbing 3-5 steps with a railing? : A Little 6 Click Score: 23    End of Session Equipment Utilized During Treatment: Gait belt Activity Tolerance: Patient tolerated treatment well;No increased pain Patient left: in chair;with call bell/phone within reach         Time: 1125-1135 PT Time Calculation (min) (ACUTE ONLY): 10 min  Charges:  $Gait Training: 8-22 mins                     C. Parks [place], [place] [facility] [phone]

## 2020-08-05 ENCOUNTER — Other Ambulatory Visit: Payer: Self-pay | Admitting: Urology

## 2020-08-05 DIAGNOSIS — K651 Peritoneal abscess: Secondary | ICD-10-CM

## 2020-08-14 ENCOUNTER — Ambulatory Visit
Admission: RE | Admit: 2020-08-14 | Discharge: 2020-08-14 | Disposition: A | Discharge status: Home / Self Care | Payer: 59 | Source: Ambulatory Visit | Attending: Student | Admitting: Student

## 2020-08-14 ENCOUNTER — Ambulatory Visit
Admission: RE | Admit: 2020-08-14 | Discharge: 2020-08-14 | Disposition: A | Discharge status: Home / Self Care | Payer: 59 | Source: Ambulatory Visit | Attending: Urology | Admitting: Urology

## 2020-08-14 DIAGNOSIS — K651 Peritoneal abscess: Secondary | ICD-10-CM

## 2020-08-14 MED ORDER — IOPAMIDOL (ISOVUE-300) INJECTION 61%
100.0000 mL | Freq: Once | INTRAVENOUS | Status: AC | PRN
  Administered 2020-08-14: 100 mL via INTRAVENOUS

## 2020-08-15 ENCOUNTER — Other Ambulatory Visit: Payer: Self-pay | Admitting: Interventional Radiology

## 2020-08-15 DIAGNOSIS — K651 Peritoneal abscess: Secondary | ICD-10-CM

## 2020-08-16 ENCOUNTER — Encounter (HOSPITAL_COMMUNITY): Payer: Self-pay

## 2020-08-16 ENCOUNTER — Other Ambulatory Visit: Payer: Self-pay | Admitting: Urology

## 2020-08-16 ENCOUNTER — Ambulatory Visit (HOSPITAL_COMMUNITY)
Admission: RE | Admit: 2020-08-16 | Discharge: 2020-08-16 | Disposition: A | Discharge status: Home / Self Care | Payer: 59 | Source: Ambulatory Visit | Attending: Interventional Radiology | Admitting: Interventional Radiology

## 2020-08-16 ENCOUNTER — Other Ambulatory Visit (HOSPITAL_COMMUNITY): Payer: Self-pay | Admitting: Interventional Radiology

## 2020-08-16 ENCOUNTER — Other Ambulatory Visit: Payer: Self-pay

## 2020-08-16 DIAGNOSIS — Z794 Long term (current) use of insulin: Secondary | ICD-10-CM | POA: Diagnosis not present

## 2020-08-16 DIAGNOSIS — Z79899 Other long term (current) drug therapy: Secondary | ICD-10-CM | POA: Diagnosis not present

## 2020-08-16 DIAGNOSIS — K651 Peritoneal abscess: Secondary | ICD-10-CM

## 2020-08-16 DIAGNOSIS — K6819 Other retroperitoneal abscess: Secondary | ICD-10-CM | POA: Insufficient documentation

## 2020-08-16 DIAGNOSIS — L0291 Cutaneous abscess, unspecified: Secondary | ICD-10-CM

## 2020-08-16 HISTORY — PX: IR SINUS/FIST TUBE CHK-NON GI: IMG673

## 2020-08-16 LAB — CBC
HCT: 37.3 % — ABNORMAL LOW (ref 39.0–52.0)
Hemoglobin: 11.7 g/dL — ABNORMAL LOW (ref 13.0–17.0)
MCH: 27.5 pg (ref 26.0–34.0)
MCHC: 31.4 g/dL (ref 30.0–36.0)
MCV: 87.8 fL (ref 80.0–100.0)
Platelets: 337 10*3/uL (ref 150–400)
RBC: 4.25 MIL/uL (ref 4.22–5.81)
RDW: 15 % (ref 11.5–15.5)
WBC: 8.9 10*3/uL (ref 4.0–10.5)
nRBC: 0 % (ref 0.0–0.2)

## 2020-08-16 LAB — GLUCOSE, CAPILLARY
Glucose-Capillary: 126 mg/dL — ABNORMAL HIGH (ref 70–99)
Glucose-Capillary: 125 mg/dL — ABNORMAL HIGH (ref 70–99)

## 2020-08-16 LAB — PROTIME-INR
INR: 1 (ref 0.8–1.2)
Prothrombin Time: 13 seconds (ref 11.4–15.2)

## 2020-08-16 MED ORDER — CEFAZOLIN SODIUM-DEXTROSE 2-4 GM/100ML-% IV SOLN
INTRAVENOUS | Status: AC
  Administered 2020-08-16: 2 g
  Filled 2020-08-16: qty 100

## 2020-08-16 MED ORDER — FENTANYL CITRATE (PF) 100 MCG/2ML IJ SOLN
INTRAMUSCULAR | Status: AC
  Filled 2020-08-16: qty 2

## 2020-08-16 MED ORDER — FENTANYL CITRATE (PF) 100 MCG/2ML IJ SOLN
INTRAMUSCULAR | Status: AC | PRN
Start: 2020-08-16 — End: 2020-08-16
  Administered 2020-08-16: 50 ug via INTRAVENOUS
  Administered 2020-08-16: 25 ug via INTRAVENOUS

## 2020-08-16 MED ORDER — SODIUM CHLORIDE 0.9 % IV SOLN
INTRAVENOUS | Status: AC | PRN
  Administered 2020-08-16: 10 mL/h via INTRAVENOUS

## 2020-08-16 MED ORDER — CEFAZOLIN SODIUM-DEXTROSE 2-4 GM/100ML-% IV SOLN: 2.0000 g | Freq: Once | INTRAVENOUS | Status: DC

## 2020-08-16 MED ORDER — MIDAZOLAM HCL 2 MG/2ML IJ SOLN
INTRAMUSCULAR | Status: AC | PRN
  Administered 2020-08-16: 0.5 mg via INTRAVENOUS
  Administered 2020-08-16: 1 mg via INTRAVENOUS

## 2020-08-16 MED ORDER — SODIUM CHLORIDE 0.9% FLUSH: 5.0000 mL | Freq: Every day | INTRAVENOUS | Status: DC

## 2020-08-16 MED ORDER — MIDAZOLAM HCL 2 MG/2ML IJ SOLN
INTRAMUSCULAR | Status: AC
  Filled 2020-08-16: qty 2

## 2020-08-16 NOTE — Progress Notes (Signed)
Instructed client to empty JP drain and client emptied JP drain himself

## 2020-08-16 NOTE — H&P (Addendum)
Chief Complaint: Patient was seen in consultation today for right retroperitoneal abscess drain aspiration/placement.  Referring Physician(s): Hassell,Daniel  Supervising Physician: Aletta Edouard  Patient Status: [facility] - Out-pt  History of Present Illness: Kirke Breach is a 64 y.o. male with a past medical history significant for OA, sciatica, skin cancer and right retroperitoneal abscess s/p drain placement 07/27/20 by Dr. Vernard Gambles who presents today for a new right retroperitoneal abscess drain placement. Mr. Dorce presented to [facility] ED on 10/22 with complaints of RLQ abdominal pain, imaging showed a right retroperitoneal abscess and he underwent drain placement 10/23 in IR. He was subsequently discharged on 10/28 with the drain in place. He was seen by urology (Dr. Lovena Neighbours) on 11/10 and underwent a repeat CT scan which showed progression and coalescence of 15 cm right retroperitoneal fluid collection and improving right iliopsoas phlegmon post percutaneous drainage - these imaging findings were reviewed by Dr. Vernard Gambles who has recommended a new RP drain placement.  Mr. Gerst reports pain in his right groin which radiates down to his right leg, but otherwise has been doing well. His current drain is to gravity and has not put out much at all in the last few days. He is still flushing the drain once daily without difficulty or pain. He has not had any fevers or chills. He was taking antibiotics but was told these were not the right ones so he is wondering who he needs to contact about this. He understands the procedure today and is agreeable to proceed.  Past Medical History:  Diagnosis Date  . ALLERGIC RHINITIS 09/08/2010   Qualifier: Diagnosis of  By: Joyce Gross    . Cancer [facility])    skin, sees Dr. Wilhemina Bonito   . OSTEOARTHRITIS 09/08/2010   Qualifier: Diagnosis of  By: Joyce Gross    . Sciatica     Past Surgical History:  Procedure Laterality Date  . BASAL CELL CARCINOMA  EXCISION    . NASAL SEPTUM SURGERY    . RADIAL HEAD EXCISION    . SQUAMOUS CELL CARCINOMA EXCISION  2013   from right ear per Dr. Ronnald Ramp     Allergies: Patient has no known allergies.  Medications: Prior to Admission medications   Medication Sig Start Date End Date Taking? Authorizing Provider  cetirizine (ZYRTEC) 10 MG tablet Take 10 mg by mouth daily.   Yes [provider]  cyclobenzaprine (FLEXERIL) 10 MG tablet Take 1 tablet (10 mg total) by mouth 3 (three) times daily as needed for muscle spasms. 08/01/20  Yes Shelly Coss, MD  ferrous sulfate 325 (65 FE) MG tablet Take 1 tablet (325 mg total) by mouth daily. 08/01/20 08/01/21 Yes Adhikari, Tamsen Meek, MD  fluticasone (FLONASE) 50 MCG/ACT nasal spray Place into both nostrils daily.   Yes [provider]  ibuprofen (ADVIL) 800 MG tablet Take 1 tablet (800 mg total) by mouth every 8 (eight) hours as needed. 08/01/20  Yes Shelly Coss, MD  insulin aspart (NOVOLOG FLEXPEN) 100 UNIT/ML FlexPen Inject 8 Units into the skin 3 (three) times daily with meals. 08/01/20  Yes Shelly Coss, MD  insulin glargine (LANTUS SOLOSTAR) 100 UNIT/ML Solostar Pen Inject 40 Units into the skin daily. 08/01/20  Yes Shelly Coss, MD  oxyCODONE 10 MG TABS Take 1 tablet (10 mg total) by mouth every 6 (six) hours as needed for moderate pain (if ibuprofen not effective). 08/01/20  Yes Shelly Coss, MD  polyethylene glycol (MIRALAX / GLYCOLAX) 17 g packet Take 17 g by mouth  daily as needed. 08/01/20  Yes Shelly Coss, MD  blood glucose meter kit and supplies Dispense based on patient and insurance preference. Use up to four times daily as directed. (FOR ICD-10 E10.9, E11.9). 08/01/20   Shelly Coss, MD  Insulin Pen Needle 32G X 4 MM MISC 1 each by Does not apply route 3 (three) times daily. 08/01/20   Shelly Coss, MD     Family History  Problem Relation Age of Onset  . Arthritis Neg Hx        family hx  . Diabetes Neg Hx         family hx  . Cancer Neg Hx        lung, prostate  . Parkinsonism Neg Hx        family hx    Social History   Socioeconomic History  . Marital status: Married    Spouse name: Not on file  . Number of children: Not on file  . Years of education: Not on file  . Highest education level: Not on file  Occupational History  . Not on file  Tobacco Use  . Smoking status: Never Smoker  . Smokeless tobacco: Never Used  Vaping Use  . Vaping Use: Never used  Substance and Sexual Activity  . Alcohol use: No  . Drug use: No  . Sexual activity: Not on file  Other Topics Concern  . Not on file  Social History Narrative  . Not on file   Social Determinants of Health   Financial Resource Strain:   . Difficulty of Paying Living Expenses: Not on file  Food Insecurity:   . Worried About Charity fundraiser in the Last Year: Not on file  . Ran Out of Food in the Last Year: Not on file  Transportation Needs:   . Lack of Transportation (Medical): Not on file  . Lack of Transportation (Non-Medical): Not on file  Physical Activity:   . Days of Exercise per Week: Not on file  . Minutes of Exercise per Session: Not on file  Stress:   . Feeling of Stress : Not on file  Social Connections:   . Frequency of Communication with Friends and Family: Not on file  . Frequency of Social Gatherings with Friends and Family: Not on file  . Attends Religious Services: Not on file  . Active Member of Clubs or Organizations: Not on file  . Attends Archivist Meetings: Not on file  . Marital Status: Not on file     Review of Systems: A 12 point ROS discussed and pertinent positives are indicated in the HPI above.  All other systems are negative.  Review of Systems  Constitutional: Negative for appetite change, chills and fever.  Respiratory: Negative for cough and shortness of breath.   Cardiovascular: Negative for chest pain.  Gastrointestinal: Positive for abdominal pain. Negative for  diarrhea, nausea and vomiting. Anal bleeding: RLQ/groin.  Musculoskeletal: Negative for back pain.  Neurological: Negative for dizziness and headaches.    Vital Signs: BP (!) 142/86   Pulse 81   Temp 98 F (36.7 C) (Oral)   Resp 16   Ht 6' (1.829 m)   Wt 225 lb (102.1 kg)   SpO2 95%   BMI 30.52 kg/m   Physical Exam Vitals reviewed.  Constitutional:      General: He is not in acute distress. HENT:     Head: Normocephalic.     Mouth/Throat:     Mouth: Mucous  membranes are moist.     Pharynx: Oropharynx is clear. No oropharyngeal exudate or posterior oropharyngeal erythema.  Cardiovascular:     Rate and Rhythm: Normal rate and regular rhythm.  Pulmonary:     Effort: Pulmonary effort is normal.     Breath sounds: Normal breath sounds.  Abdominal:     General: There is no distension.     Palpations: Abdomen is soft.     Tenderness: There is no abdominal tenderness.     Comments: (+) right RP drain to gravity with scant purulent drainage present in bag.   Skin:    General: Skin is warm and dry.  Neurological:     Mental Status: He is alert and oriented to person, place, and time.  Psychiatric:        Mood and Affect: Mood normal.        Behavior: Behavior normal.        Thought Content: Thought content normal.        Judgment: Judgment normal.      MD Evaluation Airway: WNL Heart: WNL Abdomen: WNL Chest/ Lungs: WNL ASA  Classification: 2 Mallampati/Airway Score: Two   Imaging: CT ABDOMEN PELVIS W WO CONTRAST  Result Date: 07/27/2020 CLINICAL DATA:  Perinephric abscess. EXAM: CT ABDOMEN AND PELVIS WITHOUT AND WITH CONTRAST TECHNIQUE: Multidetector CT imaging of the abdomen and pelvis was performed following the standard protocol before and following the bolus administration of intravenous contrast. The patient was scanned prone due to subsequent interventional procedure. CONTRAST:  158m OMNIPAQUE IOHEXOL 300 MG/ML  SOLN COMPARISON:  the previous day's study  FINDINGS: Lower chest: No acute abnormality. Hepatobiliary: Persistent ill-defined low-attenuation region posteriorly in hepatic segment 4 B suggesting focal fatty infiltration, approximately 3 x 2 cm. No other liver lesion or biliary ductal dilatation. Gallbladder unremarkable. Pancreas: Unremarkable. No pancreatic ductal dilatation or surrounding inflammatory changes. Spleen: Normal in size without focal abnormality. Adrenals/Urinary Tract: Normal adrenals. 4.7 cm simple cyst, exophytic from upper pole left kidney. Scattered peripheral wedge-shaped hyperdense regions in both kidneys on the precontrast study, more numerous right than left, possibly related to previous day's contrast administration. There is a wedge-shaped area of hypoperfusion in the lower pole right kidney contiguous with the perinephric and posterior retroperitoneal complex process. The process shows peripheral enhancement and loculated central low attenuation, abuts the psoas, and extends inferiorly through the retroperitoneum into the pelvis as before. No other perfusion defects in the kidneys are identified. There is no hydronephrosis. No urolithiasis. Urinary bladder physiologically distended. Stomach/Bowel: Stomach decompressed. Small bowel is nondistended. Normal appendix. Colon is nondilated, unremarkable. Vascular/Lymphatic: Mild atheromatous plaque in the infrarenal aorta. No aneurysm, dissection, or stenosis. Reproductive: Prostate is unremarkable. Other: No ascites.  No free air. Musculoskeletal: No acute or significant osseous findings. IMPRESSION: 1. No significant change in wedge-shaped area of hypoperfusion in the lower pole right kidney contiguous with the perinephric and posterior retroperitoneal complex process. Favor infectious/inflammatory process over neoplasm. Follow-up recommended to confirm appropriate resolution. 2. Scattered wedge-shaped hyperdense regions in both kidneys on the precontrast study, more numerous right  than left, possibly related to previous day's contrast administration. Correlate with any clinical or laboratory evidence of pyelonephritis. 3. Persistent ill-defined low-attenuation region posteriorly in hepatic segment 4 B, suggesting focal fatty infiltration. Consider elective outpatient liver MR with contrast for more definitive characterization. Aortic Atherosclerosis (ICD10-I70.0). Electronically Signed   By: DLucrezia EuropeM.D.   On: 07/27/2020 13:00   CT ABDOMEN PELVIS W CONTRAST  Result Date: 08/14/2020 CLINICAL  DATA:  Perinephric/retroperitoneal abscess, status post percutaneous drainage EXAM: CT ABDOMEN AND PELVIS WITH CONTRAST TECHNIQUE: Multidetector CT imaging of the abdomen and pelvis was performed using the standard protocol following bolus administration of intravenous contrast. CONTRAST:  149m ISOVUE-300 IOPAMIDOL (ISOVUE-300) INJECTION 61% COMPARISON:  07/29/2020 FINDINGS: Lower chest: Resolution of small right pleural effusion seen previously. Hepatobiliary: No focal liver abnormality is seen. No gallstones, gallbladder wall thickening, or biliary dilatation. Pancreas: Unremarkable. No pancreatic ductal dilatation or surrounding inflammatory changes. Spleen: Normal in size without focal abnormality. Adrenals/Urinary Tract: Adrenals unremarkable. Stable left upper pole renal cyst. No hydronephrosis. Urinary bladder incompletely distended. Focal hypoattenuation in the right lower pole as before suggesting focal pyelonephritis. Small posteromedial perinephric abscess decreased in size. Stomach/Bowel: Stomach decompressed. Small bowel nondilated. The colon is unremarkable. Vascular/Lymphatic: Minimal aortic atheromatous plaque. No adenopathy. Reproductive: Mild prostate enlargement stable Other: Right percutaneous iliopsoas drain catheter stable position. Regional phlegmonous change without significant undrained component. Progression and coalescence of a separate right retroperitoneal fluid  collection posterior and inferior to the cecum and ascending colon measuring at least 15 cm craniocaudal length, containing a few gas bubbles, with surrounding enhancing rim. No ascites.  No free air.  Bilateral pelvic phleboliths. Musculoskeletal: No acute or significant osseous findings. IMPRESSION: 1. Progression and coalescence of 15 cm right retroperitoneal fluid collection. Critical Value/emergent results were called by telephone at the time of interpretation on 08/14/2020 at 4:29 pm to [facility], who verbally acknowledged these results. Percutaneous drainage will be scheduled. 2. Improving right iliopsoas phlegmon post percutaneous drainage. 3. Persistent focal hypoattenuation in the right lower pole kidney suggesting focal pyelonephritis. 4. Interval resolution of small right pleural effusion. Aortic Atherosclerosis (ICD10-I70.0). Electronically Signed   By: DLucrezia EuropeM.D.   On: 08/14/2020 16:29   CT ABDOMEN PELVIS W CONTRAST  Result Date: 07/29/2020 CLINICAL DATA:  Retroperitoneal abscess. EXAM: CT ABDOMEN AND PELVIS WITH CONTRAST TECHNIQUE: Multidetector CT imaging of the abdomen and pelvis was performed using the standard protocol following bolus administration of intravenous contrast. CONTRAST:  1060mOMNIPAQUE IOHEXOL 300 MG/ML  SOLN COMPARISON:  July 27, 2020. FINDINGS: Lower chest: Minimal right pleural effusion is noted with adjacent subsegmental atelectasis. Hepatobiliary: No focal liver abnormality is seen. No gallstones, gallbladder wall thickening, or biliary dilatation. Pancreas: Unremarkable. No pancreatic ductal dilatation or surrounding inflammatory changes. Spleen: Normal in size without focal abnormality. Adrenals/Urinary Tract: Adrenal glands are unremarkable. Stable left renal cyst is noted. No hydronephrosis or renal obstruction is noted. No renal or ureteral calculi are noted. Urinary bladder is unremarkable. There is been interval placement of percutaneous  drainage catheter into probable right retroperitoneal abscess which is adjacent to lower pole of right kidney. The abscess appears to be significantly smaller compared to prior exam, although significant solid density remains suggesting some degree of phlegmon. A nother complex fluid collection is seen in the lateral portion of the right retroperitoneal region which extends down to the right lower quadrant and right inguinal region. Fluid is seen extending into the visualized portion of the upper right scrotum. Stomach/Bowel: Stomach is within normal limits. Appendix appears normal. No evidence of bowel wall thickening, distention, or inflammatory changes. Vascular/Lymphatic: No significant vascular findings are present. No enlarged abdominal or pelvic lymph nodes. Reproductive: Prostate is unremarkable. Other: No definite ascites is noted. Musculoskeletal: No acute or significant osseous findings. IMPRESSION: 1. Interval placement of percutaneous drainage catheter into probable right retroperitoneal abscess which is significantly smaller compared to prior exam, although significant solid density remains  suggesting some degree of phlegmon. Another complex fluid collection is seen in the lateral portion of the right retroperitoneal region which extends down to the right lower quadrant and right inguinal region. Fluid is seen extending into the visualized portion of the upper right scrotum. 2. Minimal right pleural effusion is noted with adjacent subsegmental atelectasis. Electronically Signed   By: Marijo Conception M.D.   On: 07/29/2020 11:34   CT Abdomen Pelvis W Contrast  Result Date: 07/26/2020 CLINICAL DATA:  Right lower quadrant abdominal pain EXAM: CT ABDOMEN AND PELVIS WITH CONTRAST TECHNIQUE: Multidetector CT imaging of the abdomen and pelvis was performed using the standard protocol following bolus administration of intravenous contrast. CONTRAST:  16m OMNIPAQUE IOHEXOL 300 MG/ML  SOLN COMPARISON:   None. FINDINGS: Lower chest: Visualized lung bases are clear. Visualized heart and pericardium are unremarkable peer Hepatobiliary: Mild hepatomegaly. No focal liver lesion. No intra or extrahepatic biliary ductal dilation. Gallbladder unremarkable. Pancreas: Unremarkable. Spleen: Moderate splenomegaly with the spleen measuring 17 cm in greatest dimension. No intrasplenic lesion identified. The splenic vein is patent. Adrenals/Urinary Tract: The adrenal glands are unremarkable. There is a heterogeneously enhancing collection seen within the posterior right pararenal space intimately associated with the posterior, inferior pole of the left kidney which demonstrates hypoenhancement on delayed images. This measures 9.2 x 5.0 cm on axial image # 38. There is no extravasation of contrast noted on delayed images. Together, the findings may reflect focal pyelonephritis with retroperitoneal abscess formation, retroperitoneal hemorrhage secondary to an underlying renal mass, or a retroperitoneal urinoma, though the latter is considered less likely given the lack of contrast extravasation. There is extension of this inflammatory or hemorrhagic process into the retroperitoneal space lateral to the right kidney and subjacent to the transversus abdominus musculature. Hypoenhancement of the lower pole of the right kidney is noted on delayed images. The kidneys are otherwise normal in size and position and demonstrate normal cortical enhancement. Simple cortical cyst within the left kidney. No hydronephrosis. No intrarenal or ureteral calculi. Bladder unremarkable. Stomach/Bowel: The stomach, small bowel, and large bowel are unremarkable. Appendix normal. No free intraperitoneal gas or fluid. Vascular/Lymphatic: Shotty pericaval adenopathy is present, likely reactive. No frankly pathologic adenopathy within the abdomen and pelvis. The abdominal vasculature is unremarkable. Reproductive: Prostate is unremarkable. Other: Rectum  unremarkable. Musculoskeletal: No acute bone abnormality. IMPRESSION: Irregularly enhancing collection within the right posterior pararenal space intimately associated with the posteroinferior pole of the right kidney extending into the right psoas and subsequently into the retroperitoneal space subjacent to the right transverse abdominus musculature. Differential considerations include hemorrhage related to an underlying renal mass, a retroperitoneal abscess related to focal pyonephrosis, or a chronic urinoma though no definite leakage of urine is seen at this time on delayed images. There is associated hypoenhancement of a the adjacent renal cortex again favoring a renal etiology. Among the differential considerations, retroperitoneal abscess is considered most likely. Exclusion of an underlying mass could be performed with dedicated renal mass protocol CT or MRI imaging. Electronically Signed   By: AFidela SalisburyMD   On: 07/26/2020 16:01   [place]SCROTUM W/DOPPLER  Result Date: 07/28/2020 CLINICAL DATA:  Scrotal sac enlargement EXAM: SCROTAL ULTRASOUND DOPPLER ULTRASOUND OF THE TESTICLES TECHNIQUE: Complete ultrasound examination of the testicles, epididymis, and other scrotal structures was performed. Color and spectral Doppler ultrasound were also utilized to evaluate blood flow to the testicles. COMPARISON:  CT abdomen pelvis 07/27/2020 FINDINGS: Right testicle Measurements: 4.8 x 2.9 x 3.3 cm. No mass  or microlithiasis visualized. Left testicle Measurements: 4.8 x 2.0 x 3.0 cm. No mass or microlithiasis visualized. Right epididymis:  Normal in size and appearance. Left epididymis:  Normal in size and appearance. Hydrocele:  Small bilateral hydroceles. Varicocele:  Bilateral varicoceles visualized. Pulsed Doppler interrogation of both testes demonstrates normal low resistance arterial and venous waveforms bilaterally. Bilateral scrotal wall thickening. There is complex fluid distending within the right  inguinal canal with low level internal echoes extending from the region of the pelvis to the scrotal sac maximal dimensions measuring 2.6 x 3.6 cm trans axially and extending up to approximately 19 cm in length. IMPRESSION: 1. Negative for testicular torsion or intratesticular mass. 2. Bilateral scrotal wall thickening. No evidence of scrotal wall fluid collection or abscess. 3. There is complex fluid tracking within the right inguinal canal extending from the pelvis to the right hemiscrotum, similar in appearance to the previous CT abdomen pelvis 07/27/2020. 4. Small bilateral hydroceles. 5. Bilateral varicoceles. Electronically Signed   By: Davina Poke D.O.   On: 07/28/2020 15:24   CT IMAGE GUIDED DRAINAGE BY PERCUTANEOUS CATHETER  Result Date: 07/27/2020 CLINICAL DATA:  Retroperitoneal abscess EXAM: CT GUIDED DRAINAGE OF RETROPERITONEAL ABSCESS ANESTHESIA/SEDATION: Intravenous Fentanyl 90mg and Versed 1.568mwere administered as conscious sedation during continuous monitoring of the patient's level of consciousness and physiological / cardiorespiratory status by the radiology RN, with a total moderate sedation time of 14 minutes. PROCEDURE: The procedure, risks, benefits, and alternatives were explained to the patient. Questions regarding the procedure were encouraged and answered. The patient understands and consents to the procedure. Patient had previously been placed prone for diagnostic CT, reported separately. The collection was localized and an appropriate skin entry site was determined and marked. The operative field was prepped with chlorhexidinein a sterile fashion, and a sterile drape was applied covering the operative field. A sterile gown and sterile gloves were used for the procedure. Local anesthesia was provided with 1% Lidocaine. Under CT fluoroscopic guidance, 18 gauge trocar needle advanced in the collection. Purulent material could be aspirated. Amplatz wire advanced easily within the  collection, confirmed on CT fluoro. Tract dilated to facilitate placement of a 12 French pigtail drain catheter, formed centrally within the collection. 30 mL of purulent material were aspirated, sent for Gram stain and culture. Catheter secured externally with 0 Prolene suture and StatLock and placed to gravity drain bag. The patient tolerated the procedure well. COMPLICATIONS: None immediate FINDINGS: Complex loculated right retroperitoneal fluid collection was localized. 12 French pigtail drain catheter placed as above. 30 mL purulent aspirate sent for Gram stain and culture. IMPRESSION: Technically successful CT-guided right retroperitoneal abscess drain catheter placement. Electronically Signed   By: D Lucrezia Europe.D.   On: 07/27/2020 13:02    Labs:  CBC: Recent Labs    07/30/20 0531 07/31/20 0451 08/01/20 0418 08/16/20 0620  WBC 17.1* 14.7* 14.4* 8.9  HGB 11.0* 11.2* 10.4* 11.7*  HCT 33.7* 35.1* 32.9* 37.3*  PLT 454* 510* 489* 337    COAGS: Recent Labs    07/26/20 1701 07/27/20 0612 08/16/20 0620  INR 1.3* 1.3* 1.0  APTT 28  --   --     BMP: Recent Labs    07/29/20 0535 07/30/20 0531 07/31/20 0451 08/01/20 0418  NA 132* 133* 134* 133*  K 3.3* 3.4* 3.8 3.6  CL 96* 98 100 100  CO2 _0 GLUCOSE 251* 220* 175* 117*  BUN _1 CALCIUM 8.1* 8.0* 8.1* 7.9*  CREATININE 0.56* 0.54* 0.62 0.56*  GFRNONAA >60 >60 >60 >60    LIVER FUNCTION TESTS: Recent Labs    07/26/20 1243 07/27/20 0612  BILITOT 0.7 1.4*  AST 12* 9*  ALT 13 11  ALKPHOS 122 127*  PROT 7.2 7.0  ALBUMIN 2.3* 2.4*    TUMOR MARKERS: No results for input(s): AFPTM, CEA, CA199, CHROMGRNA in the last 8760 hours.  Assessment and Plan:  64 y/o M with previous right retroperitoneal drain placement 07/27/20 by Dr. Vernard Gambles who was found to have progression of right RP abscess on CT dated 08/14/20 who presents today for a new drain placement/possible removal of existing drain.  Patient has  been NPO since 10 pm last night, no current anticoagulation/antiplatelet medications. Afebrile, WBC 8.9, hgb 11.7, plt 337, INR 1.0. Plan for patient to be seen in IR clinic in 10-14 days for repeat imaging - order placed for this today, IR scheduler will call patient with appointment date/time. Reviewed home drain care with patient today - flush once daily with 5 cc NS, record output QD, dressing changes every 2-3 days or earlier if soiled.  Risks and benefits discussed with the patient including bleeding, infection, damage to adjacent structures, bowel perforation/fistula connection, and sepsis.  All of the patient's questions were answered, patient is agreeable to proceed.  Consent signed and in chart.  Thank you for this interesting consult.  I greatly enjoyed meeting Kostantinos [place] and look forward to participating in their care.  A copy of this report was sent to the requesting provider on this date.  Electronically Signed: Joaquim Nam, PA-C 08/16/2020, 7:39 AM   I spent a total of 25 Minutes in face to face in clinical consultation, greater than 50% of which was counseling/coordinating care for right RP abscess drain placement.

## 2020-08-16 NOTE — Procedures (Signed)
Interventional Radiology Procedure Note  Procedure: CT Guided Drainage of right retroperitoneal abscess; injection of abscess drain.  Complications: None  Estimated Blood Loss: < 10 mL  Findings:  Injection of current posteromedial RP drain shows communication to larger non-drained abscess in RP space. No fistula to kidney or collecting system. Drain removed.  New 12 Fr drain placed in right posterolateral RP abscess with return of purulent, greenish/tan fluid. Fluid sample sent for culture analysis. Drain attached to suction bulb drainage.  Will follow in IR clinic.  Venetia Night. Kathlene Cote, M.D Pager:  [phone]

## 2020-08-16 NOTE — Sedation Documentation (Signed)
Dr Kathlene Cote checking the drain that he had before prior to giving sedation.

## 2020-08-16 NOTE — Discharge Instructions (Addendum)
Percutaneous Abscess Drain, Care After This sheet gives you information about how to care for yourself after your procedure. Your health care provider may also give you more specific instructions. If you have problems or questions, contact your health care provider. What can I expect after the procedure? After your procedure, it is common to have:  A small amount of bruising and discomfort in the area where the drainage tube (catheter) was placed.  Sleepiness and fatigue. This should go away after the medicines you were given have worn off. Follow these instructions at home: Incision care  Follow instructions from your health care provider about how to take care of your incision. Make sure you: ? Wash your hands with soap and water before you change your bandage (dressing). If soap and water are not available, use hand sanitizer. ? Change your dressing as told by your health care provider. ? Leave stitches (sutures), skin glue, or adhesive strips in place. These skin closures may need to stay in place for 2 weeks or longer. If adhesive strip edges start to loosen and curl up, you may trim the loose edges. Do not remove adhesive strips completely unless your health care provider tells you to do that.  Check your incision area every day for signs of infection. Check for: ? More redness, swelling, or pain. ? More fluid or blood. ? Warmth. ? Pus or a bad smell. ? Fluid leaking from around your catheter (instead of fluid draining through your catheter). Catheter care   Follow instructions from your health care provider about emptying and cleaning your catheter and collection bag. You may need to clean the catheter every day so it does not clog.  If directed, write down the following information every time you empty your bag: ? The date and time. ? The amount of drainage. General instructions  Rest at home for 1-2 days after your procedure. Return to your normal activities as told by your  health care provider.  Do not take baths, swim, or use a hot tub for 24 hours after your procedure, or until your health care provider says that this is okay.  Take over-the-counter and prescription medicines only as told by your health care provider.  Keep all follow-up visits as told by your health care provider. This is important. Contact a health care provider if:  You have less than 10 mL of drainage a day for 2-3 days in a row, or as directed by your health care provider.  You have more redness, swelling, or pain around your incision area.  You have more fluid or blood coming from your incision area.  Your incision area feels warm to the touch.  You have pus or a bad smell coming from your incision area.  You have fluid leaking from around your catheter (instead of through your catheter).  You have a fever or chills.  You have pain that does not get better with medicine. Get help right away if:  Your catheter comes out.  You suddenly stop having drainage from your catheter.  You suddenly have blood in the fluid that is draining from your catheter.  You become dizzy or you faint.  You develop a rash.  You have nausea or vomiting.  You have difficulty breathing or you feel short of breath.  You develop chest pain.  You have problems with your speech or vision.  You have trouble balancing or moving your arms or legs. Summary  It is common to have a small   amount of bruising and discomfort in the area where the drainage tube (catheter) was placed.  You may be directed to record the amount of drainage from the bag every time you empty it.  Follow instructions from your health care provider about emptying and cleaning your catheter and collection bag. This information is not intended to replace advice given to you by your health care provider. Make sure you discuss any questions you have with your health care provider. Document Revised: 09/03/2017 Document  Reviewed: 08/13/2016 Elsevier Patient Education  2020 Else[employer] Inc. Moderate Conscious Sedation, Adult, Care After These instructions provide you with information about caring for yourself after your procedure. Your health care provider may also give you more specific instructions. Your treatment has been planned according to current medical practices, but problems sometimes occur. Call your health care provider if you have any problems or questions after your procedure. What can I expect after the procedure? After your procedure, it is common:  To feel sleepy for several hours.  To feel clumsy and have poor balance for several hours.  To have poor judgment for several hours.  To vomit if you eat too soon. Follow these instructions at home: For at least 24 hours after the procedure:   Do not: ? Participate in activities where you could fall or become injured. ? Drive. ? Use heavy machinery. ? Drink alcohol. ? Take sleeping pills or medicines that cause drowsiness. ? Make important decisions or sign legal documents. ? Take care of children on your own.  Rest. Eating and drinking  Follow the diet recommended by your health care provider.  If you vomit: ? Drink water, juice, or soup when you can drink without vomiting. ? Make sure you have little or no nausea before eating solid foods. General instructions  Have a responsible adult stay with you until you are awake and alert.  Take over-the-counter and prescription medicines only as told by your health care provider.  If you smoke, do not smoke without supervision.  Keep all follow-up visits as told by your health care provider. This is important. Contact a health care provider if:  You keep feeling nauseous or you keep vomiting.  You feel light-headed.  You develop a rash.  You have a fever. Get help right away if:  You have trouble breathing. This information is not intended to replace advice given to you by  your health care provider. Make sure you discuss any questions you have with your health care provider. Document Revised: 09/03/2017 Document Reviewed: 01/11/2016 Elsevier Patient Education  2020 Elsevier Inc.  

## 2020-08-20 ENCOUNTER — Encounter: Payer: 59 | Attending: Internal Medicine | Admitting: Dietician

## 2020-08-20 ENCOUNTER — Other Ambulatory Visit: Payer: Self-pay

## 2020-08-20 ENCOUNTER — Encounter: Payer: Self-pay | Admitting: Dietician

## 2020-08-20 DIAGNOSIS — E118 Type 2 diabetes mellitus with unspecified complications: Secondary | ICD-10-CM | POA: Insufficient documentation

## 2020-08-20 NOTE — Progress Notes (Signed)
Diabetes Self-Management Education  Visit Type: First/Initial  Appt. Start Time: 1440 Appt. End Time: 1093  08/20/2020  Mr. Paul Estes, identified by name and date of birth, is a 64 y.o. male with a diagnosis of Diabetes: Type 2.   ASSESSMENT Patient is here today alone.  History includes:  Type 2 diabetes (newly diagnosed 07/2020), COVID 06/2020, kidney abscess, recent steroids. Labs noted to include A1C 14.3% 07/28/2020, kidney labs WNL  Weight Hx:   lost 60-65 lbs, eating less, cooking his own meals, separation 225 lbs per patient now  Patient lives alone.  He does his own shopping and cooking.  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 [place] farm and gets exercise by walking the dogs and working on the farm  Height 6' (1.829 m), weight 225 lb (102.1 kg). Body mass index is 30.52 kg/m.   Diabetes Self-Management Education - 08/20/20 1453      Visit Information   Visit Type First/Initial      Initial Visit   Diabetes Type Type 2    Are you currently following a meal plan? No    Are you taking your medications as prescribed? Yes    Date Diagnosed 07/2020      Health Coping   How would you rate your overall health? Good      Psychosocial Assessment   Self-care barriers None    Self-management support Doctor's office;CDE visits    Other persons present Patient    Patient Concerns Nutrition/Meal planning    Special Needs None    Preferred Learning Style No preference indicated    Learning Readiness Ready    How often do you need to have someone help you when you read instructions, pamphlets, or other written materials from your doctor or pharmacy? 1 - Never    What is the last grade level you completed in school? BS      Pre-Education Assessment   Patient understands the diabetes disease and treatment process. Needs Instruction    Patient understands incorporating nutritional management into lifestyle. Needs Instruction     Patient undertands incorporating physical activity into lifestyle. Needs Instruction    Patient understands using medications safely. Needs Instruction    Patient understands monitoring blood glucose, interpreting and using results Needs Instruction    Patient understands prevention, detection, and treatment of acute complications. Needs Instruction    Patient understands prevention, detection, and treatment of chronic complications. Needs Instruction    Patient understands how to develop strategies to address psychosocial issues. Needs Instruction    Patient understands how to develop strategies to promote health/change behavior. Needs Instruction      Complications   Last HgB A1C per patient/outside source 14.3 %   07/28/2020   How often do you check your blood sugar? 3-4 times/day    Fasting Blood glucose range (mg/dL) 70-129    Postprandial Blood glucose range (mg/dL) 130-179    Number of hypoglycemic episodes per month 0    Number of hyperglycemic episodes per week 0    Have you had a dilated eye exam in the past 12 months? Yes    Have you had a dental exam in the past 12 months? No    Are you checking your feet? Yes    How many days per week are you checking your feet? 5      Dietary Intake   Breakfast scrambled egg, cheese, bacon    Lunch grilled  chicken kabob and greek salad    Dinner shrimp, vegetables    Snack (evening) fruit and cheese    Beverage(s) water, unsweetend tea, seltzer, no alcohol      Exercise   Exercise Type Light (walking / raking leaves)    How many days per week to you exercise? 5    How many minutes per day do you exercise? 30    Total minutes per week of exercise 150      Patient Education   Previous Diabetes Education No    Disease state  Definition of diabetes, type 1 and 2, and the diagnosis of diabetes    Nutrition management  Role of diet in the treatment of diabetes and the relationship between the three main macronutrients and blood glucose  level;Food label reading, portion sizes and measuring food.;Meal options for control of blood glucose level and chronic complications.;Meal timing in regards to the patients' current diabetes medication.    Physical activity and exercise  Role of exercise on diabetes management, blood pressure control and cardiac health.    Medications Reviewed patients medication for diabetes, action, purpose, timing of dose and side effects.    Monitoring Taught/discussed recording of test results and interpretation of SMBG.;Identified appropriate SMBG and/or A1C goals.;Daily foot exams;Yearly dilated eye exam    Acute complications Taught treatment of hypoglycemia - the 15 rule.;Discussed and identified patients' treatment of hyperglycemia.    Chronic complications Relationship between chronic complications and blood glucose control;Assessed and discussed foot care and prevention of foot problems    Psychosocial adjustment Role of stress on diabetes      Individualized Goals (developed by patient)   Nutrition General guidelines for healthy choices and portions discussed;Follow meal plan discussed    Physical Activity Exercise 5-7 days per week;30 minutes per day    Medications take my medication as prescribed    Monitoring  test my blood glucose as discussed    Reducing Risk examine blood glucose patterns;increase portions of healthy fats      Post-Education Assessment   Patient understands the diabetes disease and treatment process. Demonstrates understanding / competency    Patient understands incorporating nutritional management into lifestyle. Demonstrates understanding / competency    Patient undertands incorporating physical activity into lifestyle. Demonstrates understanding / competency    Patient understands using medications safely. Demonstrates understanding / competency    Patient understands monitoring blood glucose, interpreting and using results Demonstrates understanding / competency     Patient understands prevention, detection, and treatment of acute complications. Demonstrates understanding / competency    Patient understands prevention, detection, and treatment of chronic complications. Demonstrates understanding / competency    Patient understands how to develop strategies to address psychosocial issues. Demonstrates understanding / competency    Patient understands how to develop strategies to promote health/change behavior. Demonstrates understanding / competency      Outcomes   Expected Outcomes Demonstrated interest in learning. Expect positive outcomes    Future DMSE PRN    Program Status Completed           Individualized Plan for Diabetes Self-Management Training:   Learning Objective:  Patient will have a greater understanding of diabetes self-management. Patient education plan is to attend individual and/or group sessions per assessed needs and concerns.   Plan:   Patient Instructions  Doristine Devoid job on the changes that you have made! Stay active most every day.  Aim for at least 30 minutes daily. Aim for 30 grams of carbohydrates with each meal.  If you are hungry you can increase this to around 60 grams per meal. Small amount of protein with each meal. Continue to take your medication as prescribed. Continue to check your blood sugar as recommended.     Expected Outcomes:  Demonstrated interest in learning. Expect positive outcomes  Education material provided: ADA - How to Thrive: A Guide for Your Journey with Diabetes, Food label handouts, Meal plan card, Snack sheet and Diabetes Resources, [employer] brochure  If problems or questions, patient to contact team via:  Phone and Email  Future DSME appointment: PRN

## 2020-08-20 NOTE — Patient Instructions (Signed)
Great job on the changes that you have made! Stay active most every day.  Aim for at least 30 minutes daily. Aim for 30 grams of carbohydrates with each meal.  If you are hungry you can increase this to around 60 grams per meal. Small amount of protein with each meal. Continue to take your medication as prescribed. Continue to check your blood sugar as recommended.

## 2020-08-21 LAB — AEROBIC/ANAEROBIC CULTURE W GRAM STAIN (SURGICAL/DEEP WOUND)

## 2020-08-28 ENCOUNTER — Ambulatory Visit
Admission: RE | Admit: 2020-08-28 | Discharge: 2020-08-28 | Disposition: A | Discharge status: Home / Self Care | Payer: 59 | Source: Ambulatory Visit | Attending: Physician Assistant | Admitting: Physician Assistant

## 2020-08-28 ENCOUNTER — Ambulatory Visit
Admission: RE | Admit: 2020-08-28 | Discharge: 2020-08-28 | Disposition: A | Discharge status: Home / Self Care | Payer: 59 | Source: Ambulatory Visit | Attending: Urology | Admitting: Urology

## 2020-08-28 DIAGNOSIS — K651 Peritoneal abscess: Secondary | ICD-10-CM

## 2020-08-28 HISTORY — PX: IR RADIOLOGIST EVAL & MGMT: IMG5224

## 2020-08-28 MED ORDER — IOPAMIDOL (ISOVUE-300) INJECTION 61%
100.0000 mL | Freq: Once | INTRAVENOUS | Status: AC | PRN
  Administered 2020-08-28: 100 mL via INTRAVENOUS

## 2020-08-28 NOTE — Progress Notes (Signed)
Referring Physician(s): Dr Loletha Grayer Lovena Neighbours  Chief Complaint: The patient is seen in follow up today s/p new percutaneous drainage catheter was placed in the right lateral retroperitoneal abscess with return of purulent fluid 08/16/20 in IR  History of present illness:  Right-sided posterior retroperitoneal abscess extending to abut the posterior capsular surface of the kidney-- IR drain was placed initially 07/27/20.   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 New drain was placed into right lateral retroperitoneal abscess 08/16/20.  Pt is here today for CT imaging and evaluation He denies fever/chills Denies pain OP of drain is minimal to none now 2-3 days Does not flush drain Finishes antibiotics in 2 days  To see Dr Lovena Neighbours 09/09/20  Past Medical History:  Diagnosis Date  . ALLERGIC RHINITIS 09/08/2010   Qualifier: Diagnosis of  By: Joyce Gross    . Cancer [facility])    skin, sees Dr. Wilhemina Bonito   . Diabetes mellitus without complication ([place])   . OSTEOARTHRITIS 09/08/2010   Qualifier: Diagnosis of  By: Joyce Gross    . Sciatica     Past Surgical History:  Procedure Laterality Date  . BASAL CELL CARCINOMA EXCISION    . IR SINUS/FIST TUBE CHK-NON GI  08/16/2020  . NASAL SEPTUM SURGERY    . RADIAL HEAD EXCISION    . SQUAMOUS CELL CARCINOMA EXCISION  2013   from right ear per Dr. Ronnald Ramp     Allergies: Patient has no known allergies.  Medications: Prior to Admission medications   Medication Sig Start Date End Date Taking? Authorizing Provider  blood glucose meter kit and supplies Dispense based on patient and insurance preference. Use up to four times daily as directed. (FOR ICD-10 E10.9, E11.9). 08/01/20   Shelly Coss, MD  cetirizine (ZYRTEC) 10 MG tablet Take 10 mg by mouth daily. Patient not taking: Reported on 08/20/2020    [provider]    cyclobenzaprine (FLEXERIL) 10 MG tablet Take 1 tablet (10 mg total) by mouth 3 (three) times daily as needed for muscle spasms. 08/01/20   Shelly Coss, MD  ferrous sulfate 325 (65 FE) MG tablet Take 1 tablet (325 mg total) by mouth daily. 08/01/20 08/01/21  Shelly Coss, MD  fluticasone (FLONASE) 50 MCG/ACT nasal spray Place into both nostrils daily.    [provider]  ibuprofen (ADVIL) 800 MG tablet Take 1 tablet (800 mg total) by mouth every 8 (eight) hours as needed. 08/01/20   Shelly Coss, MD  insulin aspart (NOVOLOG FLEXPEN) 100 UNIT/ML FlexPen Inject 8 Units into the skin 3 (three) times daily with meals. 08/01/20   Shelly Coss, MD  insulin glargine (LANTUS SOLOSTAR) 100 UNIT/ML Solostar Pen Inject 40 Units into the skin daily. 08/01/20   Shelly Coss, MD  Insulin Pen Needle 32G X 4 MM MISC 1 each by Does not apply route 3 (three) times daily. 08/01/20   Shelly Coss, MD  oxyCODONE 10 MG TABS Take 1 tablet (10 mg total) by mouth every 6 (six) hours as needed for moderate pain (if ibuprofen not effective). Patient not taking: Reported on 08/20/2020 08/01/20   Shelly Coss, MD  polyethylene glycol (MIRALAX / GLYCOLAX) 17 g packet Take 17 g by mouth daily as needed. Patient not taking: Reported on 08/20/2020 08/01/20   Shelly Coss, MD  TURMERIC PO Take by mouth.    [provider]     Family History  Problem Relation Age of Onset  .  Arthritis Neg Hx        family hx  . Diabetes Neg Hx        family hx  . Cancer Neg Hx        lung, prostate  . Parkinsonism Neg Hx        family hx    Social History   Socioeconomic History  . Marital status: Married    Spouse name: Not on file  . Number of children: Not on file  . Years of education: Not on file  . Highest education level: Not on file  Occupational History  . Not on file  Tobacco Use  . Smoking status: Never Smoker  . Smokeless tobacco: Never Used  Vaping Use  . Vaping Use:  Never used  Substance and Sexual Activity  . Alcohol use: No  . Drug use: No  . Sexual activity: Not on file  Other Topics Concern  . Not on file  Social History Narrative  . Not on file   Social Determinants of Health   Financial Resource Strain:   . Difficulty of Paying Living Expenses: Not on file  Food Insecurity:   . Worried About Charity fundraiser in the Last Year: Not on file  . Ran Out of Food in the Last Year: Not on file  Transportation Needs:   . Lack of Transportation (Medical): Not on file  . Lack of Transportation (Non-Medical): Not on file  Physical Activity:   . Days of Exercise per Week: Not on file  . Minutes of Exercise per Session: Not on file  Stress:   . Feeling of Stress : Not on file  Social Connections:   . Frequency of Communication with Friends and Family: Not on file  . Frequency of Social Gatherings with Friends and Family: Not on file  . Attends Religious Services: Not on file  . Active Member of Clubs or Organizations: Not on file  . Attends Archivist Meetings: Not on file  . Marital Status: Not on file     Vital Signs: There were no vitals taken for this visit.  Physical Exam Skin:    General: Skin is warm.     Comments: Site od Rt lateral Retroperitoneal abscess drain  Is clean and dry NT no bleeding No sign of infection  CT revealing resolved collection  Drain removed and dressing placed per Dr Serafina Royals     Imaging: No results found.  Labs:  CBC: Recent Labs    07/30/20 0531 07/31/20 0451 08/01/20 0418 08/16/20 0620  WBC 17.1* 14.7* 14.4* 8.9  HGB 11.0* 11.2* 10.4* 11.7*  HCT 33.7* 35.1* 32.9* 37.3*  PLT 454* 510* 489* 337    COAGS: Recent Labs    07/26/20 1701 07/27/20 0612 08/16/20 0620  INR 1.3* 1.3* 1.0  APTT 28  --   --     BMP: Recent Labs    07/29/20 0535 07/30/20 0531 07/31/20 0451 08/01/20 0418  NA 132* 133* 134* 133*  K 3.3* 3.4* 3.8 3.6  CL 96* 98 100 100  CO2 25 25 25 24    GLUCOSE 251* 220* 175* 117*  BUN 12 13 16 13   CALCIUM 8.1* 8.0* 8.1* 7.9*  CREATININE 0.56* 0.54* 0.62 0.56*  GFRNONAA >60 >60 >60 >60    LIVER FUNCTION TESTS: Recent Labs    07/26/20 1243 07/27/20 0612  BILITOT 0.7 1.4*  AST 12* 9*  ALT 13 11  ALKPHOS 122 127*  PROT 7.2 7.0  ALBUMIN  2.3* 2.4*    Assessment:  Right retroperitoneal abscess resolved per CT Drain removal and dressing placed per Dr Serafina Royals To follow with Dr Loletha Grayer Lovena Neighbours 09/09/20 Pt has good understanding of this plan  Signed: Lavonia Drafts, PA-C 08/28/2020, 12:58 PM   Please refer to Dr. Serafina Royals attestation of this note for management and plan.

## 2020-10-22 ENCOUNTER — Ambulatory Visit: Payer: Self-pay

## 2020-10-22 ENCOUNTER — Ambulatory Visit: Payer: 59

## 2020-10-22 ENCOUNTER — Ambulatory Visit
Admission: EM | Admit: 2020-10-22 | Discharge: 2020-10-22 | Disposition: A | Discharge status: Home / Self Care | Payer: 59

## 2020-10-22 ENCOUNTER — Ambulatory Visit (INDEPENDENT_AMBULATORY_CARE_PROVIDER_SITE_OTHER): Payer: 59

## 2020-10-22 DIAGNOSIS — M25559 Pain in unspecified hip: Secondary | ICD-10-CM

## 2020-10-22 DIAGNOSIS — G8929 Other chronic pain: Secondary | ICD-10-CM | POA: Diagnosis not present

## 2020-10-22 DIAGNOSIS — M1611 Unilateral primary osteoarthritis, right hip: Secondary | ICD-10-CM | POA: Diagnosis not present

## 2020-10-22 MED ORDER — MELOXICAM 15 MG PO TABS
15.0000 mg | ORAL_TABLET | Freq: Every day | ORAL | 1 refills | Status: DC
Start: 2020-10-22 — End: 2022-02-25

## 2020-10-22 MED ORDER — HYDROCODONE-ACETAMINOPHEN 5-325 MG PO TABS
1.0000 | ORAL_TABLET | Freq: Four times a day (QID) | ORAL | 0 refills | Status: DC | PRN
Start: 2020-10-22 — End: 2022-02-25

## 2020-10-22 NOTE — ED Notes (Signed)
L hip pain since October.  Started when he had COVID but has been unable to tx it d/t other medical issues. Was seen with UC previously and it was suspected to be sciatica.  Has tried Oxycodone and Gabapentin which helped to some extent.

## 2020-10-22 NOTE — ED Provider Notes (Signed)
Paul Estes    CSN: [id number] Arrival date & time: 10/22/20  0856      History   Chief Complaint No chief complaint on file.   HPI Paul Estes is a 65 y.o. male.   Patient is a 65 year old male who presents today with chronic right hip pain.  This is been ongoing issue, waxing waning since October.  Has never had imaging of the hip.  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  All of this has resolved.  Has been taking gabapentin and prescribed oxycodone in the past for pain.  Pain is worse in the nighttime.  Pain starts in the right hip and travels into the right upper leg area.  No new falls or injuries.  Some numbness in the leg.     Past Medical History:  Diagnosis Date  . ALLERGIC RHINITIS 09/08/2010   Qualifier: Diagnosis of  By: Joyce Gross    . Cancer [facility])    skin, sees Dr. Wilhemina Bonito   . Diabetes mellitus without complication ([place])   . OSTEOARTHRITIS 09/08/2010   Qualifier: Diagnosis of  By: Joyce Gross    . Sciatica     Patient Active Problem List   Diagnosis Date Noted  . Abnormal CT of the abdomen 07/26/2020  . Flank pain 07/26/2020  . ALLERGIC RHINITIS 09/08/2010  . OSTEOARTHRITIS 09/08/2010    Past Surgical History:  Procedure Laterality Date  . BASAL CELL CARCINOMA EXCISION    . IR RADIOLOGIST EVAL & MGMT  08/28/2020  . IR SINUS/FIST TUBE CHK-NON GI  08/16/2020  . NASAL SEPTUM SURGERY    . RADIAL HEAD EXCISION    . SQUAMOUS CELL CARCINOMA EXCISION  2013   from right ear per Dr. Ronnald Ramp        Home Medications    Prior to Admission medications   Medication Sig Start Date End Date Taking? Authorizing Provider  gabapentin (NEURONTIN) 300 MG capsule Take 300 mg by mouth 3 (three) times daily.   Yes [provider]  HYDROcodone-acetaminophen (NORCO/VICODIN) 5-325 MG tablet Take 1-2 tablets by mouth every 6 (six) hours as needed. 10/22/20  Yes Jantz Main A, NP  meloxicam (MOBIC) 15 MG tablet Take 1  tablet (15 mg total) by mouth daily. 10/22/20  Yes Aydin Hink A, NP  blood glucose meter kit and supplies Dispense based on patient and insurance preference. Use up to four times daily as directed. (FOR ICD-10 E10.9, E11.9). 08/01/20   Shelly Coss, MD  cetirizine (ZYRTEC) 10 MG tablet Take 10 mg by mouth daily. Patient not taking: Reported on 08/20/2020    [provider]  cyclobenzaprine (FLEXERIL) 10 MG tablet Take 1 tablet (10 mg total) by mouth 3 (three) times daily as needed for muscle spasms. 08/01/20   Shelly Coss, MD  ferrous sulfate 325 (65 FE) MG tablet Take 1 tablet (325 mg total) by mouth daily. 08/01/20 08/01/21  Shelly Coss, MD  fluticasone (FLONASE) 50 MCG/ACT nasal spray Place into both nostrils daily.    [provider]  ibuprofen (ADVIL) 800 MG tablet Take 1 tablet (800 mg total) by mouth every 8 (eight) hours as needed. 08/01/20   Shelly Coss, MD  insulin aspart (NOVOLOG FLEXPEN) 100 UNIT/ML FlexPen Inject 8 Units into the skin 3 (three) times daily with meals. 08/01/20   Shelly Coss, MD  insulin glargine (LANTUS SOLOSTAR) 100 UNIT/ML Solostar Pen Inject 40 Units into the skin daily. 08/01/20   Shelly Coss, MD  Insulin Pen Needle  32G X 4 MM MISC 1 each by Does not apply route 3 (three) times daily. 08/01/20   Shelly Coss, MD  TURMERIC PO Take by mouth.    [provider]    Family History Family History  Problem Relation Age of Onset  . Cancer Mother   . Parkinsonism Father   . Arthritis Neg Hx        family hx  . Diabetes Neg Hx        family hx    Social History Social History   Tobacco Use  . Smoking status: Never Smoker  . Smokeless tobacco: Never Used  Vaping Use  . Vaping Use: Never used  Substance Use Topics  . Alcohol use: No  . Drug use: No     Allergies   Patient has no known allergies.   Review of Systems Review of Systems   Physical Exam Triage Vital Signs ED Triage Vitals [10/22/20  0903]  Enc Vitals Group     BP (!) 169/92     Pulse Rate 78     Resp 18     Temp 98.6 F (37 C)     Temp Source Oral     SpO2 98 %     Weight      Height      Head Circumference      Peak Flow      Pain Score      Pain Loc      Pain Edu?      Excl. in [place]?    No data found.  Updated Vital Signs BP (!) 169/92 (BP Location: Left Arm)   Pulse 78   Temp 98.6 F (37 C) (Oral)   Resp 18   SpO2 98%   Visual Acuity Right Eye Distance:   Left Eye Distance:   Bilateral Distance:    Right Eye Near:   Left Eye Near:    Bilateral Near:     Physical Exam Vitals and nursing note reviewed.  Constitutional:      Appearance: Normal appearance.  HENT:     Head: Normocephalic and atraumatic.  Eyes:     Conjunctiva/sclera: Conjunctivae normal.  Pulmonary:     Effort: Pulmonary effort is normal.  Musculoskeletal:        General: Normal range of motion.     Cervical back: Normal range of motion.       Legs:     Comments: Pain with ROM. Limping with ambulation.   Skin:    General: Skin is warm and dry.  Neurological:     Mental Status: He is alert.  Psychiatric:        Mood and Affect: Mood normal.      UC Treatments / Results  Labs (all labs ordered are listed, but only abnormal results are displayed) Labs Reviewed - No data to display  EKG   Radiology DG Hip Unilat With Pelvis 2-3 Views Right  Result Date: 10/22/2020 CLINICAL DATA:  Right hip pain EXAM: DG HIP (WITH OR WITHOUT PELVIS) 2-3V RIGHT COMPARISON:  CT 08/28/2020 FINDINGS: No acute fracture or dislocation. Moderate osteoarthritis of the bilateral hips, right worse than left, as manifested by joint space narrowing, subchondral sclerosis, and marginal osteophyte formation. No focal bone lesion is seen. IMPRESSION: Moderate osteoarthritis of the bilateral hips, right worse than left. Electronically Signed   By: Davina Poke D.O.   On: 10/22/2020 09:32    Procedures Procedures (including critical care  time)  Medications  Ordered in UC Medications - No data to display  Initial Impression / Assessment and Plan / UC Course  I have reviewed the triage vital signs and the nursing notes.  Pertinent labs & imaging results that were available during my care of the patient were reviewed by me and considered in my medical decision making (see chart for details).     Chronic hip pain with moderate osteoarthritis of the right hip X ray reviewed Sending to orthopedic for further management He may benefit from steroid injection or maybe even surgery.  Prescribing meloxicam for pain, inflammation.  hydrocodone at bedtime as needed for severe pain.  Final Clinical Impressions(s) / UC Diagnoses   Final diagnoses:  Chronic hip pain  Osteoarthritis of right hip, unspecified osteoarthritis type     Discharge Instructions     You have moderate osteoarthritis of the right hip. I believe is causing your symptoms. I have referred you to orthopedics. You need to see them for follow up. Meloxicam daily for pain and inflammation Hydrocodone at bedtime for severe pain as needed.      ED Prescriptions    Medication Sig Dispense Auth. Provider   meloxicam (MOBIC) 15 MG tablet Take 1 tablet (15 mg total) by mouth daily. 30 tablet Derold Dorsch A, NP   HYDROcodone-acetaminophen (NORCO/VICODIN) 5-325 MG tablet Take 1-2 tablets by mouth every 6 (six) hours as needed. 20 tablet Melvyn Hommes A, NP     I have reviewed the PDMP during this encounter.   Loura Halt A, NP 10/22/20 1016

## 2020-10-22 NOTE — Discharge Instructions (Signed)
You have moderate osteoarthritis of the right hip. I believe is causing your symptoms. I have referred you to orthopedics. You need to see them for follow up. Meloxicam daily for pain and inflammation Hydrocodone at bedtime for severe pain as needed.

## 2021-04-28 ENCOUNTER — Encounter: Payer: Self-pay | Admitting: Gastroenterology

## 2022-02-25 ENCOUNTER — Ambulatory Visit (INDEPENDENT_AMBULATORY_CARE_PROVIDER_SITE_OTHER): Payer: Medicare HMO

## 2022-02-25 ENCOUNTER — Ambulatory Visit: Payer: Medicare HMO | Admitting: Podiatry

## 2022-02-25 DIAGNOSIS — M722 Plantar fascial fibromatosis: Secondary | ICD-10-CM

## 2022-02-25 DIAGNOSIS — E109 Type 1 diabetes mellitus without complications: Secondary | ICD-10-CM | POA: Diagnosis not present

## 2022-02-25 MED ORDER — TRIAMCINOLONE ACETONIDE 10 MG/ML IJ SUSP
10.0000 mg | Freq: Once | INTRAMUSCULAR | Status: AC
  Administered 2022-02-25: 10 mg

## 2022-02-25 NOTE — Patient Instructions (Signed)

## 2022-02-25 NOTE — Progress Notes (Signed)
Subjective:   Patient ID: Paul Estes, adult   DOB: 66 y.o.   MRN: [id number]   HPI Patient states he has a lot of pain in his right heel and its been present for a number years but has gotten worse recently.  He is trying to lose weight and was diagnosed with diabetes and is trying to reduce and be active and exercise.  Patient had orthotics in the past that have worn out.  Patient does not smoke and is doing a good job controlling his diabetes   Review of Systems  All other systems reviewed and are negative.      Objective:  Physical Exam Vitals and nursing note reviewed.  Constitutional:      Appearance: He is well-developed.  Pulmonary:     Effort: Pulmonary effort is normal.  Musculoskeletal:        General: Normal range of motion.  Skin:    General: Skin is warm.  Neurological:     Mental Status: He is alert.    Neurovascular status intact muscle strength found to be adequate range of motion within normal limits with patient noted to have exquisite discomfort plantar aspect right heel at the insertional point of the tendon into the calcaneus with inflammation fluid around the medial band.  Patient is noted to have good digital perfusion well oriented x3 with moderate high arch foot structure     Assessment:  Acute plantar fasciitis right with inflammation fluid around the medial band     Plan:  H&P reviewed x-rays and went ahead today did sterile prep and injected the fascia at insertion 3 mg Kenalog 5 mg Xylocaine and applied fascial brace to hold up the arch.  Discussed utilization of orthotics and patient will bring in his old orthotics for me to evaluate.  X-rays do indicate high arch foot structure moderate spur formation

## 2022-03-11 ENCOUNTER — Ambulatory Visit: Payer: Medicare HMO | Admitting: Podiatry

## 2022-03-11 ENCOUNTER — Encounter: Payer: Self-pay | Admitting: Podiatry

## 2022-03-11 ENCOUNTER — Ambulatory Visit: Payer: Medicare HMO

## 2022-03-11 DIAGNOSIS — M722 Plantar fascial fibromatosis: Secondary | ICD-10-CM

## 2022-03-11 NOTE — Progress Notes (Signed)
Subjective:   Patient ID: Paul Estes, adult   DOB: 66 y.o.   MRN: [id number]   HPI Patient presents stating he is improved with his heel but he knows he needs to be active do his diet his diabetes and his orthotics have basically flattened out and worn out   ROS      Objective:  Physical Exam  Neurovascular status intact A1c currently doing well but moderate obesity is noted with diminishment of discomfort in the plantar fascia     Assessment:  Planter fasciitis improving still present with flatfoot deformity history of orthotics which do not fit him properly     Plan:  H&P reviewed condition recommended physical therapy anti-inflammatories to be continued along with shoe gear modification and pedorthist casted for functional orthotic devices to reduce plantar pain in the feet

## 2022-03-11 NOTE — Progress Notes (Signed)
SITUATION Reason for Consult: Evaluation for Bilateral Custom Foot Orthoses Patient / Caregiver Report: Patient is ready for foot orthotics  OBJECTIVE DATA: Patient History / Diagnosis:    ICD-10-CM   1. Plantar fasciitis  M72.2       Current or Previous Devices:   Current user  Foot Examination: Skin presentation:   Intact Ulcers & Callousing:   None Toe / Foot Deformities:  None Weight Bearing Presentation:  Rectus Sensation:    Intact  Shoe Size:    73M  ORTHOTIC RECOMMENDATION Recommended Device: 1x pair of custom functional foot orthotics  GOALS OF ORTHOSES - Reduce Pain - Prevent Foot Deformity - Prevent Progression of Further Foot Deformity - Relieve Pressure - Improve the Overall Biomechanical Function of the Foot and Lower Extremity.  ACTIONS PERFORMED Potential out of pocket cost was communicated to patient. Patient understood and consent to casting. Patient was casted for Foot Orthoses via crush box. Procedure was explained and patient tolerated procedure well. Casts were shipped to central fabrication. All questions were answered and concerns addressed.  PLAN Patient is to be called for fitting when devices are ready.

## 2022-04-22 ENCOUNTER — Ambulatory Visit (INDEPENDENT_AMBULATORY_CARE_PROVIDER_SITE_OTHER): Payer: Medicare HMO

## 2022-04-22 DIAGNOSIS — M722 Plantar fascial fibromatosis: Secondary | ICD-10-CM

## 2022-04-22 NOTE — Progress Notes (Signed)
Patient presents today to pick up custom molded foot orthotics, diagnosed with plantar fasciitis by Dr. Paulla Dolly.   Orthotics were dispensed and fit was satisfactory. Reviewed instructions for break-in and wear. Written instructions given to patient.  Patient will follow up as needed.   [facility] - order # [id number]

## 2022-09-10 IMAGING — CT CT ABD-PELV W/ CM
2 of 5 series · 16 of 46 positions shown, 18 images · IV contrast (OMNIPAQUE)
Comparison: July 27, 2020.

CLINICAL DATA: Retroperitoneal abscess.

EXAM:
CT ABDOMEN AND PELVIS WITH CONTRAST
TECHNIQUE: Multidetector CT imaging of the abdomen and pelvis was performed
using the standard protocol following bolus administration of
intravenous contrast.
CONTRAST:  100mL OMNIPAQUE IOHEXOL 300 MG/ML  SOLN

[Series 2: axial st · axial · 0.92mm/px · z∈[+1344,+1809]mm · 13 of 109 slices shown, 15 images]
[im 8/109  soft-tissue]
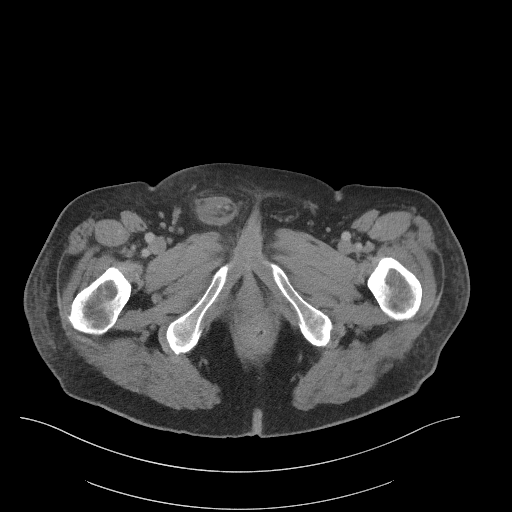
[im 8/109  bone]
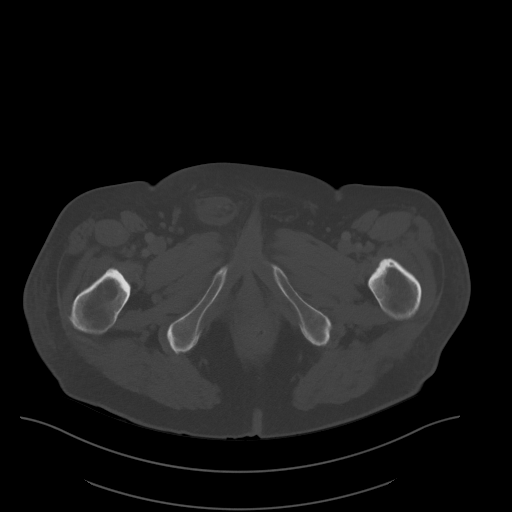
[im 15/109  soft-tissue]
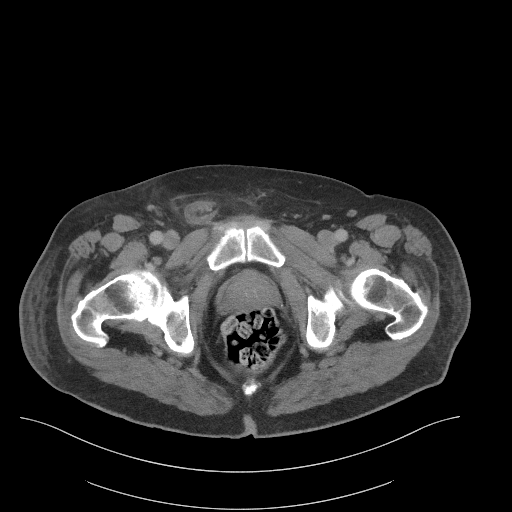
[im 22/109  soft-tissue]
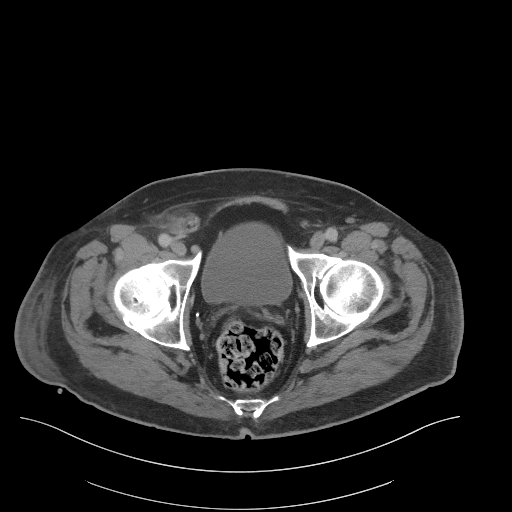
[im 29/109  soft-tissue]
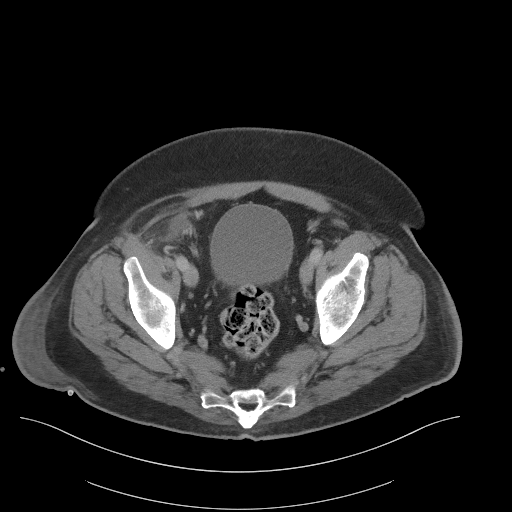
[im 37/109  soft-tissue]
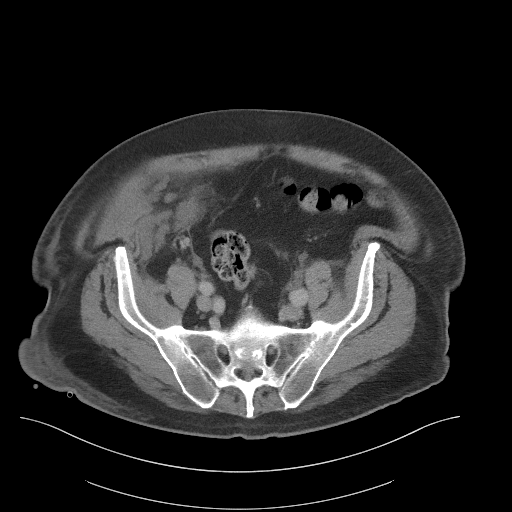
[im 44/109  soft-tissue]
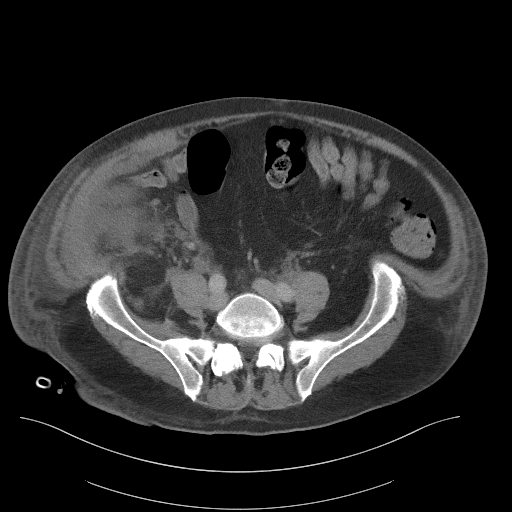
[im 58/109  soft-tissue]
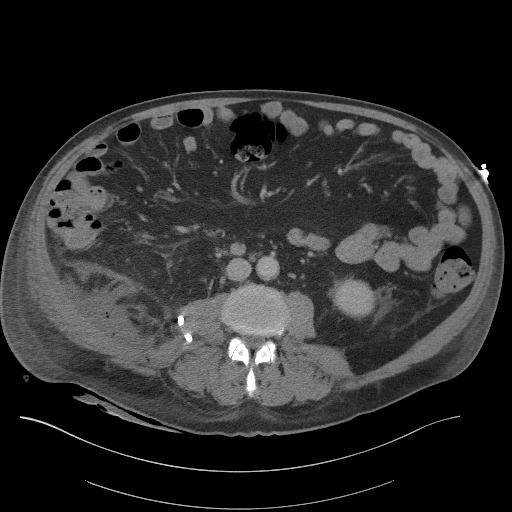
[im 65/109  soft-tissue]
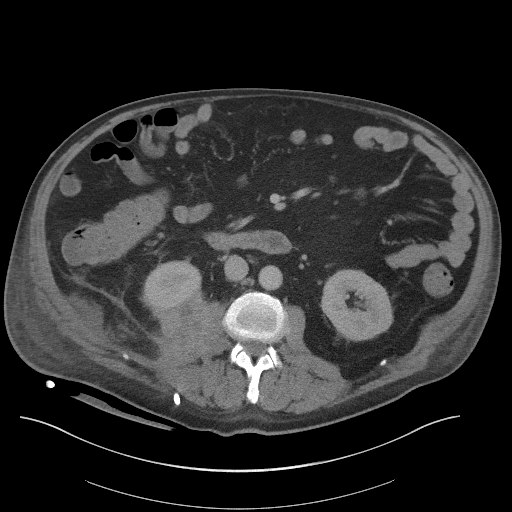
[im 73/109  soft-tissue]
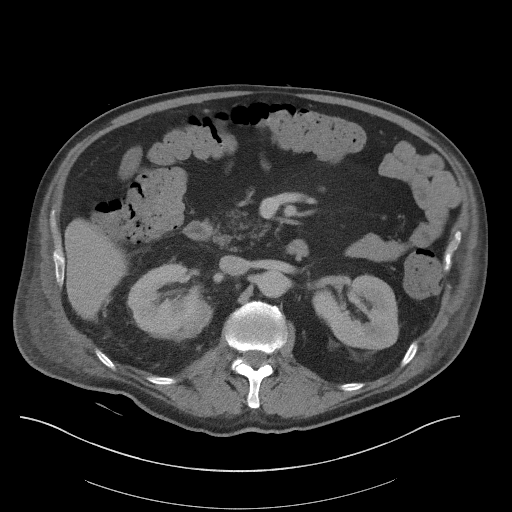
[im 73/109  bone]
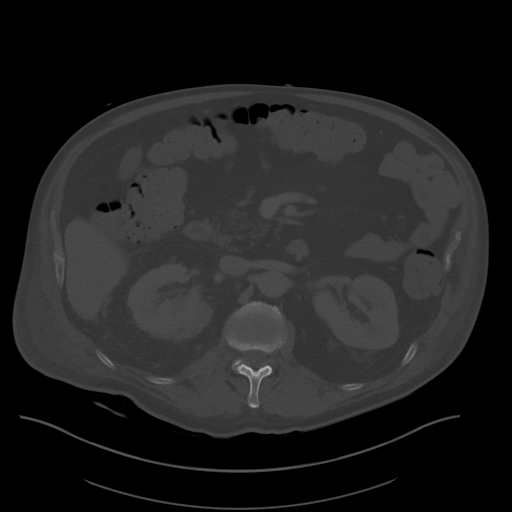
[im 80/109  soft-tissue]
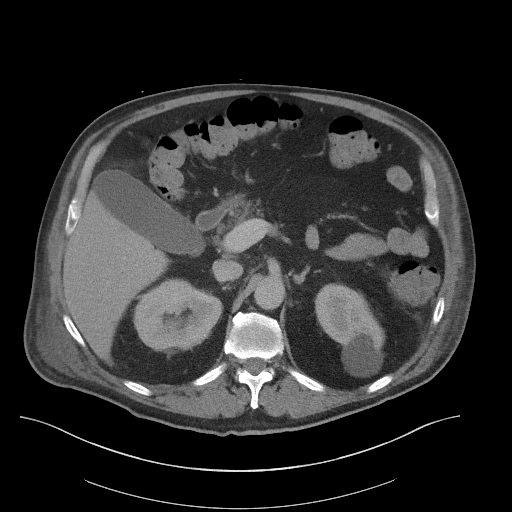
[im 87/109  soft-tissue]
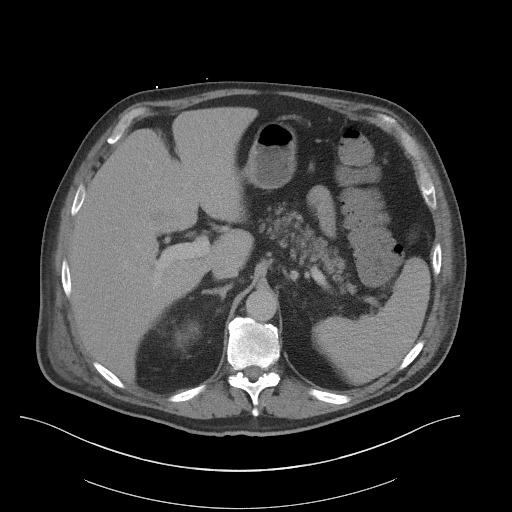
[im 94/109  soft-tissue]
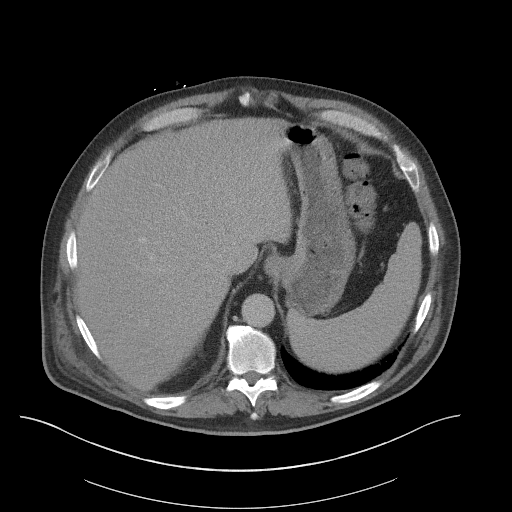
[im 101/109  soft-tissue]
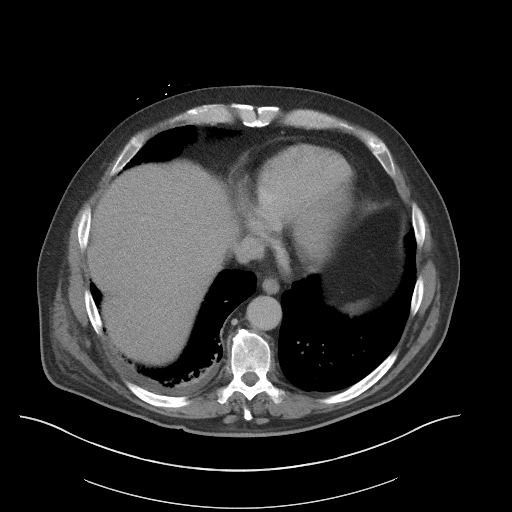

[Series 4: coronal st · coronal · 0.98mm/px · 3 of 105 slices shown]
[im 35/105  soft-tissue]
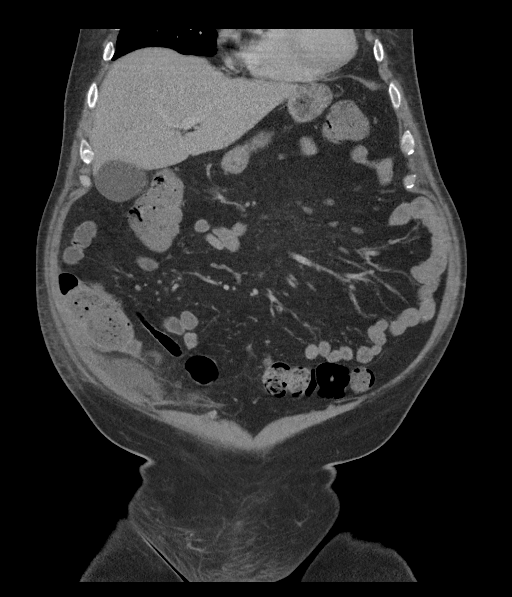
[im 47/105  soft-tissue]
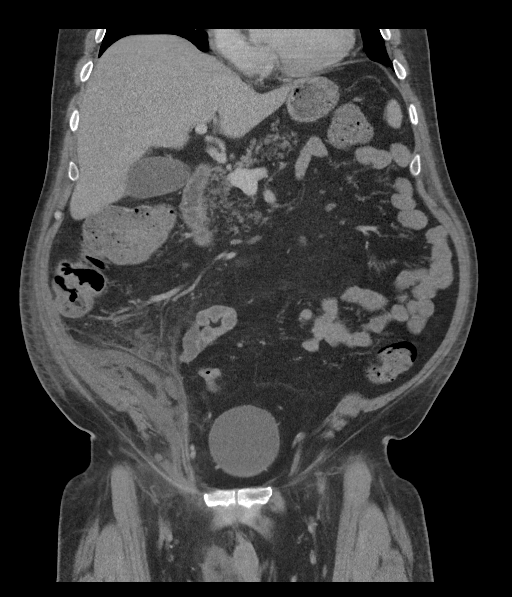
[im 58/105  soft-tissue]
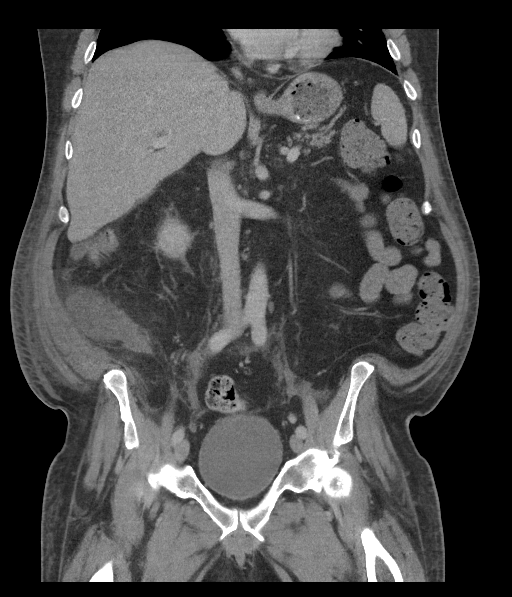

[16 of 46 positions shown; findings below may reference images not displayed]

FINDINGS: Lower chest: Minimal right pleural effusion is noted with adjacent
subsegmental atelectasis.

Hepatobiliary: No focal liver abnormality is seen. No gallstones,
gallbladder wall thickening, or biliary dilatation.

Pancreas: Unremarkable. No pancreatic ductal dilatation or
surrounding inflammatory changes.

Spleen: Normal in size without focal abnormality.

Adrenals/Urinary Tract: Adrenal glands are unremarkable. Stable left
renal cyst is noted. No hydronephrosis or renal obstruction is
noted. No renal or ureteral calculi are noted. Urinary bladder is
unremarkable. There is been interval placement of percutaneous
drainage catheter into probable right retroperitoneal abscess which
is adjacent to lower pole of right kidney. The abscess appears to be
significantly smaller compared to prior exam, although significant
solid density remains suggesting some degree of phlegmon. A nother
complex fluid collection is seen in the lateral portion of the right
retroperitoneal region which extends down to the right lower
quadrant and right inguinal region. Fluid is seen extending into the
visualized portion of the upper right scrotum.

Stomach/Bowel: Stomach is within normal limits. Appendix appears
normal. No evidence of bowel wall thickening, distention, or
inflammatory changes.

Vascular/Lymphatic: No significant vascular findings are present. No
enlarged abdominal or pelvic lymph nodes.

Reproductive: Prostate is unremarkable.

Other: No definite ascites is noted.

Musculoskeletal: No acute or significant osseous findings.
IMPRESSION: 1. Interval placement of percutaneous drainage catheter into
probable right retroperitoneal abscess which is significantly
smaller compared to prior exam, although significant solid density
remains suggesting some degree of phlegmon. Another complex fluid
collection is seen in the lateral portion of the right
retroperitoneal region which extends down to the right lower
quadrant and right inguinal region. Fluid is seen extending into the
visualized portion of the upper right scrotum.
2. Minimal right pleural effusion is noted with adjacent
subsegmental atelectasis.

## 2022-12-04 IMAGING — DX DG HIP (WITH OR WITHOUT PELVIS) 2-3V*R*
3 series · 3 of 3 positions shown · non-contrast
Comparison: CT 08/28/2020

CLINICAL DATA: Right hip pain

EXAM:
DG HIP (WITH OR WITHOUT PELVIS) 2-3V RIGHT

[pelvis ap]
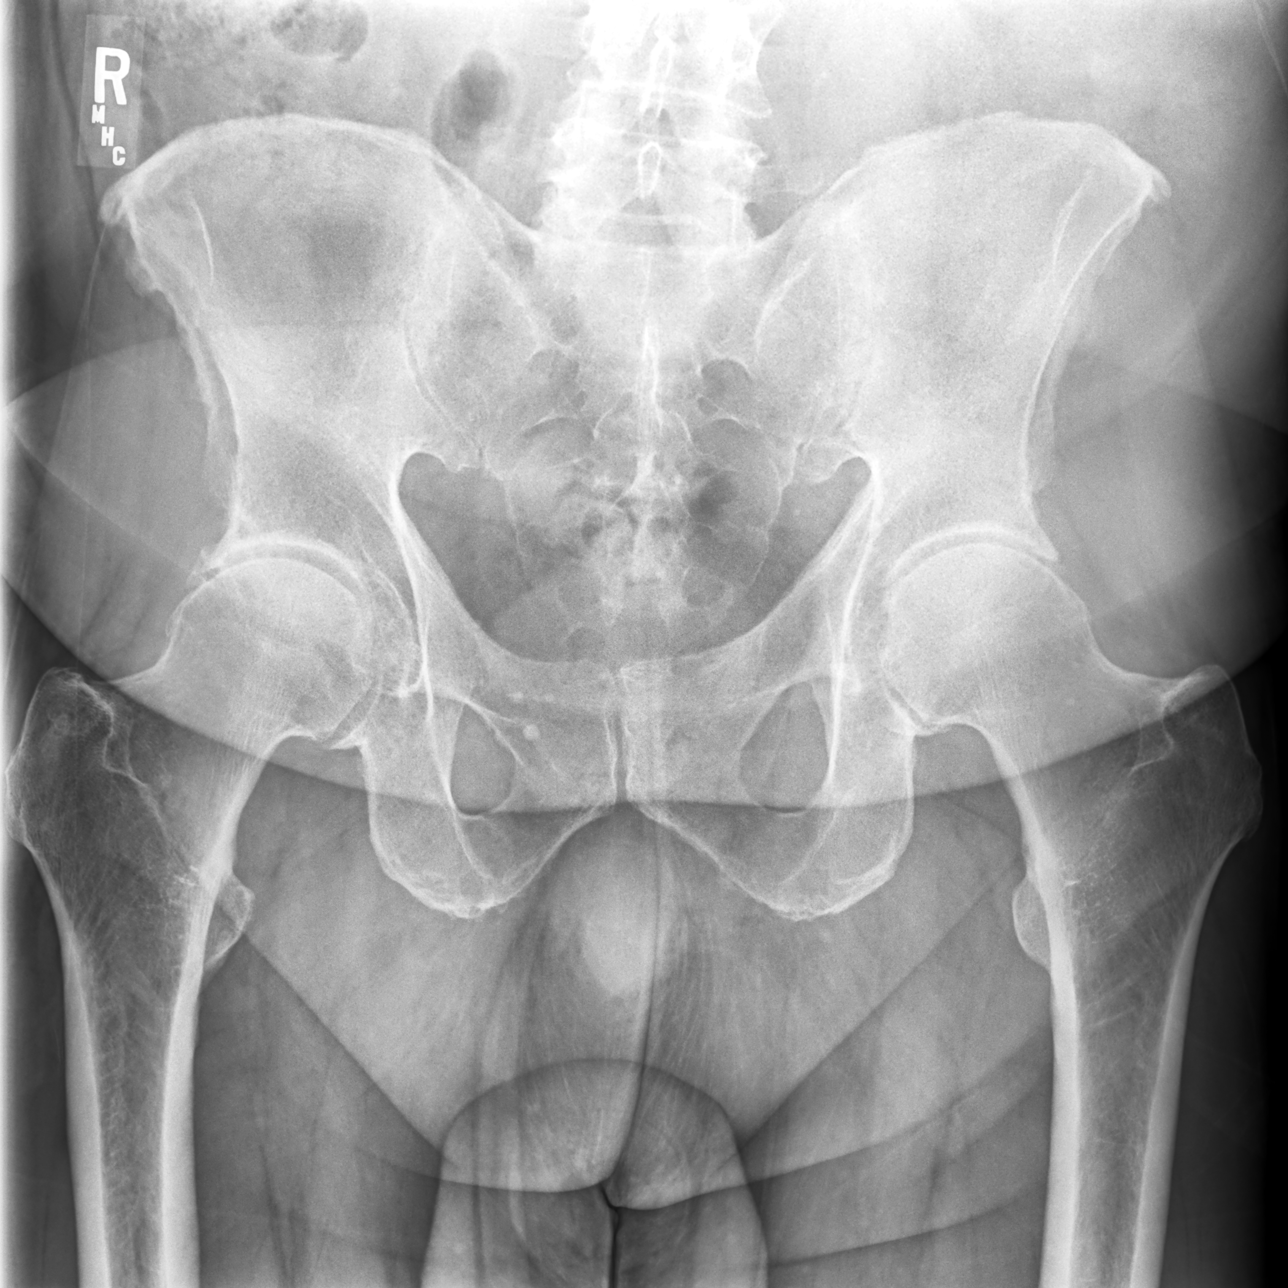

[hip ap]
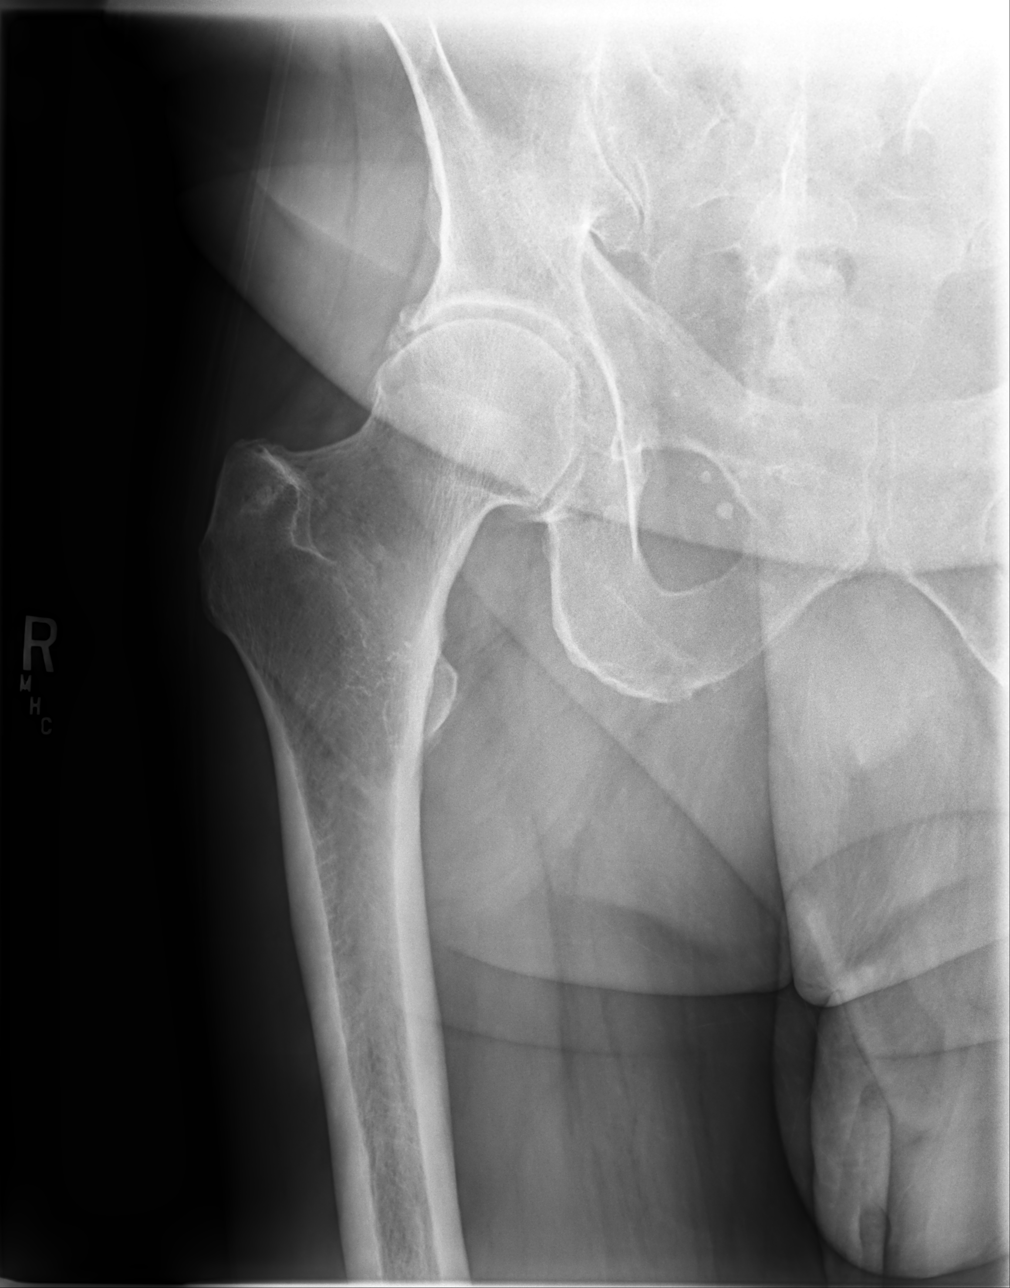

[hip (frog leg)]
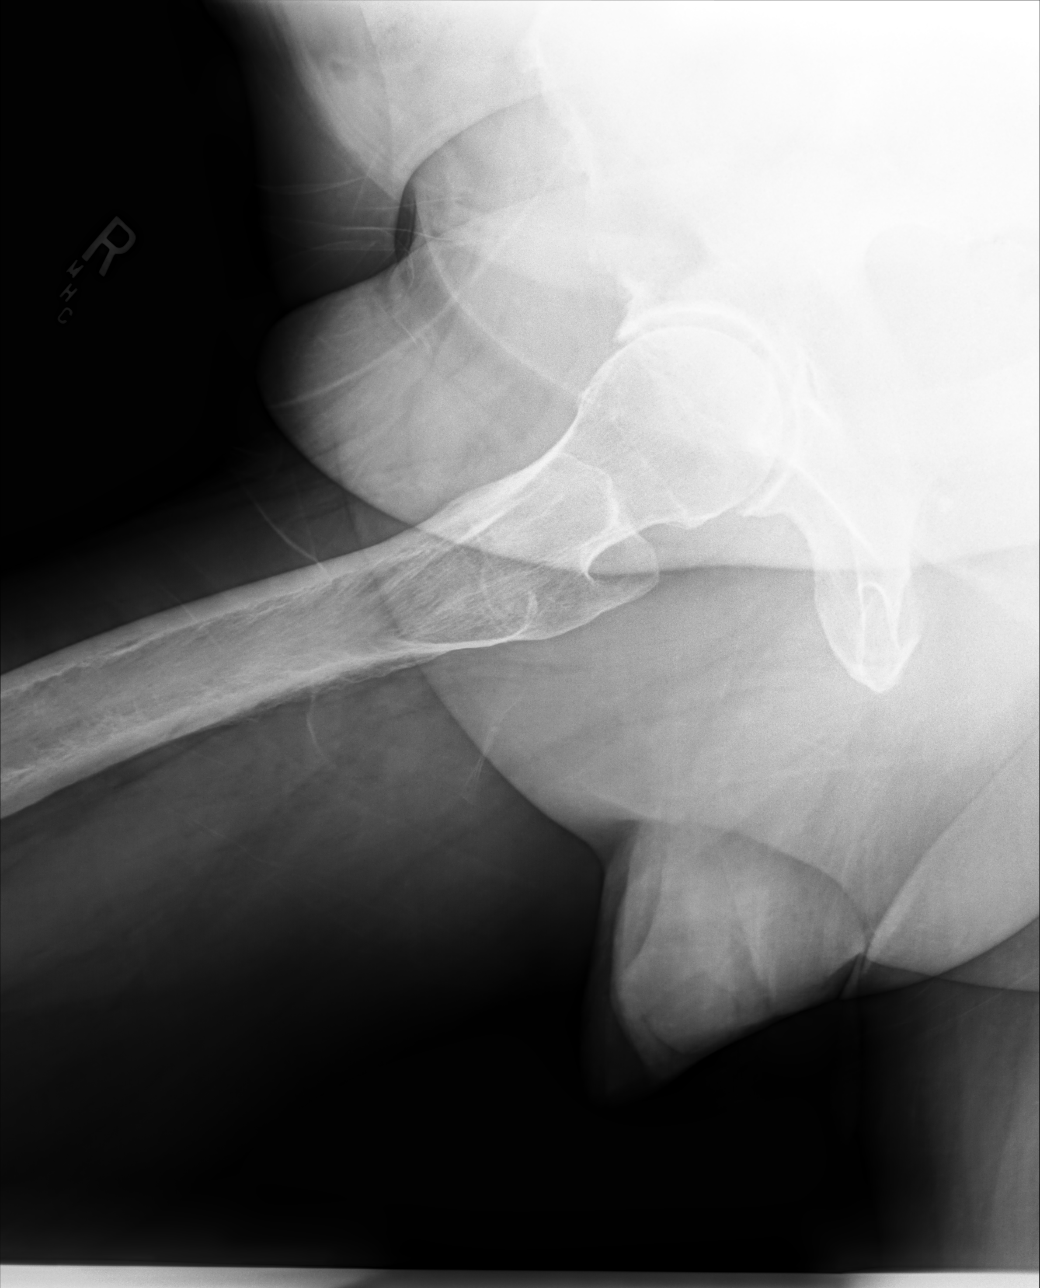

[3 of 3 positions shown; findings below may reference images not displayed]

FINDINGS: No acute fracture or dislocation. Moderate osteoarthritis of the
bilateral hips, right worse than left, as manifested by joint space
narrowing, subchondral sclerosis, and marginal osteophyte formation.
No focal bone lesion is seen.
IMPRESSION: Moderate osteoarthritis of the bilateral hips, right worse than
left.

## 2022-12-22 ENCOUNTER — Ambulatory Visit
Admission: RE | Admit: 2022-12-22 | Discharge: 2022-12-22 | Disposition: A | Discharge status: Home / Self Care | Payer: Medicare HMO | Source: Ambulatory Visit

## 2022-12-22 VITALS — BP 144/88 | HR 72 | Temp 98.0°F | Resp 18

## 2022-12-22 DIAGNOSIS — S70361A Insect bite (nonvenomous), right thigh, initial encounter: Secondary | ICD-10-CM | POA: Diagnosis not present

## 2022-12-22 DIAGNOSIS — L03115 Cellulitis of right lower limb: Secondary | ICD-10-CM | POA: Diagnosis not present

## 2022-12-22 DIAGNOSIS — W57XXXA Bitten or stung by nonvenomous insect and other nonvenomous arthropods, initial encounter: Secondary | ICD-10-CM

## 2022-12-22 MED ORDER — DOXYCYCLINE HYCLATE 100 MG PO CAPS: 100.0000 mg | ORAL_CAPSULE | Freq: Two times a day (BID) | ORAL | 0 refills | Status: AC

## 2022-12-22 NOTE — ED Triage Notes (Signed)
Patient to Urgent Care with complaints of tick bite present to right thigh. States he is concerned about potential infection.   Reports yesterday morning he noticed the area was swelling/ draining with a hard knot.

## 2022-12-22 NOTE — Discharge Instructions (Addendum)
Take the doxycycline as directed.    Follow up with your primary care provider if your symptoms are not improving.    

## 2022-12-22 NOTE — ED Provider Notes (Signed)
Paul Estes    CSN: [id number] Arrival date & time: 12/22/22  0851      History   Chief Complaint Chief Complaint  Patient presents with   Insect Bite    Potentially infected tick bite - Entered by patient    HPI Paul Estes is a 67 y.o. adult.  Patient presents with a tick bite on his right thigh x 3-4 days.  He noted the area has become red and is draining pus.  No fever, other rash, headache, malaise, or other symptoms.  No OTC medications today.  His medical history includes diabetes.    The history is provided by the patient and medical records.    Past Medical History:  Diagnosis Date   ALLERGIC RHINITIS 09/08/2010   Qualifier: Diagnosis of  By: Joyce Gross     Cancer [facility])    skin, sees Dr. Wilhemina Bonito    Diabetes mellitus without complication ([place])    OSTEOARTHRITIS 09/08/2010   Qualifier: Diagnosis of  By: Joyce Gross     Sciatica     Patient Active Problem List   Diagnosis Date Noted   Abnormal CT of the abdomen 07/26/2020   Flank pain 07/26/2020   ALLERGIC RHINITIS 09/08/2010   OSTEOARTHRITIS 09/08/2010    Past Surgical History:  Procedure Laterality Date   BASAL CELL CARCINOMA EXCISION     IR RADIOLOGIST EVAL & MGMT  08/28/2020   IR SINUS/FIST TUBE CHK-NON GI  08/16/2020   NASAL SEPTUM SURGERY     RADIAL HEAD EXCISION     SQUAMOUS CELL CARCINOMA EXCISION  2013   from right ear per Dr. Ronnald Ramp     OB History   No obstetric history on file.      Home Medications    Prior to Admission medications   Medication Sig Start Date End Date Taking? Authorizing Provider  doxycycline (VIBRAMYCIN) 100 MG capsule Take 1 capsule (100 mg total) by mouth 2 (two) times daily for 7 days. 12/22/22 12/29/22 Yes Sharion Balloon, NP  metFORMIN (GLUCOPHAGE) 500 MG tablet Take by mouth. 10/27/22  Yes [provider]  OZEMPIC, 0.25 OR 0.5 MG/DOSE, 2 MG/3ML SOPN Inject into the skin. 12/17/22  Yes [provider]  blood glucose meter kit  and supplies Dispense based on patient and insurance preference. Use up to four times daily as directed. (FOR ICD-10 E10.9, E11.9). 08/01/20   Shelly Coss, MD  ferrous sulfate 325 (65 FE) MG tablet Take 1 tablet (325 mg total) by mouth daily. 08/01/20 08/01/21  Shelly Coss, MD  fluticasone (FLONASE) 50 MCG/ACT nasal spray Place into both nostrils daily.    [provider]  ibuprofen (ADVIL) 800 MG tablet Take 1 tablet (800 mg total) by mouth every 8 (eight) hours as needed. 08/01/20   Shelly Coss, MD  insulin aspart (NOVOLOG FLEXPEN) 100 UNIT/ML FlexPen Inject 8 Units into the skin 3 (three) times daily with meals. Patient not taking: Reported on 12/22/2022 08/01/20   Shelly Coss, MD  insulin glargine (LANTUS SOLOSTAR) 100 UNIT/ML Solostar Pen Inject 40 Units into the skin daily. Patient not taking: Reported on 12/22/2022 08/01/20   Shelly Coss, MD  Insulin Pen Needle 32G X 4 MM MISC 1 each by Does not apply route 3 (three) times daily. 08/01/20   Shelly Coss, MD  rosuvastatin (CRESTOR) 10 MG tablet Take 10 mg by mouth at bedtime.    [provider]  TURMERIC PO Take by mouth.    [provider]  Family History Family History  Problem Relation Age of Onset   Cancer Mother    Parkinsonism Father    Arthritis Neg Hx        family hx   Diabetes Neg Hx        family hx    Social History Social History   Tobacco Use   Smoking status: Never   Smokeless tobacco: Never  Vaping Use   Vaping Use: Never used  Substance Use Topics   Alcohol use: No   Drug use: No     Allergies   Patient has no known allergies.   Review of Systems Review of Systems  Constitutional:  Negative for chills and fever.  Skin:  Positive for color change and wound.  Neurological:  Negative for weakness, numbness and headaches.  All other systems reviewed and are negative.    Physical Exam Triage Vital Signs ED Triage Vitals  Enc Vitals Group     BP  12/22/22 0901 (!) 144/88     Pulse Rate 12/22/22 0859 72     Resp 12/22/22 0859 18     Temp 12/22/22 0859 98 F (36.7 C)     Temp src --      SpO2 12/22/22 0859 95 %     Weight --      Height --      Head Circumference --      Peak Flow --      Pain Score 12/22/22 0859 1     Pain Loc --      Pain Edu? --      Excl. in [place]? --    No data found.  Updated Vital Signs BP (!) 144/88   Pulse 72   Temp 98 F (36.7 C)   Resp 18   SpO2 95%   Visual Acuity Right Eye Distance:   Left Eye Distance:   Bilateral Distance:    Right Eye Near:   Left Eye Near:    Bilateral Near:     Physical Exam Vitals and nursing note reviewed.  Constitutional:      General: He is not in acute distress.    Appearance: Normal appearance. He is well-developed. He is not ill-appearing.  HENT:     Mouth/Throat:     Mouth: Mucous membranes are moist.  Cardiovascular:     Rate and Rhythm: Normal rate and regular rhythm.  Pulmonary:     Effort: Pulmonary effort is normal. No respiratory distress.  Musculoskeletal:        General: Normal range of motion.     Cervical back: Neck supple.  Skin:    General: Skin is warm and dry.     Capillary Refill: Capillary refill takes less than 2 seconds.     Findings: Erythema and lesion present.     Comments: 4 cm area of erythema with central open lesion.  Neurological:     General: No focal deficit present.     Mental Status: He is alert and oriented to person, place, and time.     Sensory: No sensory deficit.     Motor: No weakness.     Gait: Gait normal.  Psychiatric:        Mood and Affect: Mood normal.        Behavior: Behavior normal.      UC Treatments / Results  Labs (all labs ordered are listed, but only abnormal results are displayed) Labs Reviewed - No data to display  EKG  Radiology No results found.  Procedures Procedures (including critical care time)  Medications Ordered in UC Medications - No data to display  Initial  Impression / Assessment and Plan / UC Course  I have reviewed the triage vital signs and the nursing notes.  Pertinent labs & imaging results that were available during my care of the patient were reviewed by me and considered in my medical decision making (see chart for details).    Tick bite of right thigh with mild cellulitis.  Afebrile, VSS.  No other rash; no bulls eye rash at site.  Because patient is diabetic and the wound is draining, treating with doxycycline.  Wound care instructions and signs of worsening infection discussed.  Education provided on tick bite and cellulitis.  Instructed patient to follow up with his PCP if his symptoms are not improving.  He agrees to plan of care.    Final Clinical Impressions(s) / UC Diagnoses   Final diagnoses:  Tick bite of right thigh, initial encounter  Cellulitis of right lower extremity     Discharge Instructions      Take the doxycycline as directed.  Follow up with your primary care provider if your symptoms are not improving.        ED Prescriptions     Medication Sig Dispense Auth. Provider   doxycycline (VIBRAMYCIN) 100 MG capsule Take 1 capsule (100 mg total) by mouth 2 (two) times daily for 7 days. 14 capsule Sharion Balloon, NP      PDMP not reviewed this encounter.   Sharion Balloon, NP 12/22/22 [phone]

## 2023-08-25 ENCOUNTER — Other Ambulatory Visit: Payer: Self-pay

## 2023-08-25 ENCOUNTER — Emergency Department (HOSPITAL_BASED_OUTPATIENT_CLINIC_OR_DEPARTMENT_OTHER)
Admission: EM | Admit: 2023-08-25 | Discharge: 2023-08-25 | Disposition: A | Discharge status: Home / Self Care | Payer: Managed Care, Other (non HMO) | Attending: Emergency Medicine | Admitting: Emergency Medicine

## 2023-08-25 ENCOUNTER — Ambulatory Visit: Payer: Self-pay

## 2023-08-25 ENCOUNTER — Emergency Department (HOSPITAL_BASED_OUTPATIENT_CLINIC_OR_DEPARTMENT_OTHER): Payer: Managed Care, Other (non HMO)

## 2023-08-25 DIAGNOSIS — Z7984 Long term (current) use of oral hypoglycemic drugs: Secondary | ICD-10-CM | POA: Diagnosis not present

## 2023-08-25 DIAGNOSIS — M7981 Nontraumatic hematoma of soft tissue: Secondary | ICD-10-CM | POA: Insufficient documentation

## 2023-08-25 DIAGNOSIS — Z794 Long term (current) use of insulin: Secondary | ICD-10-CM | POA: Diagnosis not present

## 2023-08-25 DIAGNOSIS — E119 Type 2 diabetes mellitus without complications: Secondary | ICD-10-CM | POA: Insufficient documentation

## 2023-08-25 DIAGNOSIS — S8011XA Contusion of right lower leg, initial encounter: Secondary | ICD-10-CM

## 2023-08-25 DIAGNOSIS — M7989 Other specified soft tissue disorders: Secondary | ICD-10-CM | POA: Diagnosis present

## 2023-08-25 NOTE — ED Triage Notes (Signed)
Pt from UC, c/o R lower leg redness, swelling and pain x2-3 wks with concern of DVT. No PMH DVT or PE. Does not take blood thinner med.

## 2023-08-25 NOTE — ED Provider Notes (Signed)
[place] EMERGENCY DEPARTMENT AT [facility] Provider Note   CSN: [id number] Arrival date & time: 08/25/23  1541     History  Chief Complaint  Patient presents with   Leg Swelling    Paul Estes is a 67 y.o. adult.  Patient is a 67 year old male with a history of diabetes who is presenting today with an ongoing tender spot on his right lateral lower leg.  He cannot recall a specific trauma but reports he does hit his legs on things intermittently.  He noticed it because it was tender.  He denies any other symptoms.  No chest pain, shortness of breath, redness.  No difficulty walking.  Patient reports he went to the urgent care and they sent him here to rule out a DVT.  He does not take any anticoagulation.  The history is provided by the patient and the spouse.       Home Medications Prior to Admission medications   Medication Sig Start Date End Date Taking? Authorizing Provider  blood glucose meter kit and supplies Dispense based on patient and insurance preference. Use up to four times daily as directed. (FOR ICD-10 E10.9, E11.9). 08/01/20   Burnadette Pop, MD  ferrous sulfate 325 (65 FE) MG tablet Take 1 tablet (325 mg total) by mouth daily. 08/01/20 08/01/21  Burnadette Pop, MD  fluticasone (FLONASE) 50 MCG/ACT nasal spray Place into both nostrils daily.    [provider]  ibuprofen (ADVIL) 800 MG tablet Take 1 tablet (800 mg total) by mouth every 8 (eight) hours as needed. 08/01/20   Burnadette Pop, MD  insulin aspart (NOVOLOG FLEXPEN) 100 UNIT/ML FlexPen Inject 8 Units into the skin 3 (three) times daily with meals. Patient not taking: Reported on 12/22/2022 08/01/20   Burnadette Pop, MD  insulin glargine (LANTUS SOLOSTAR) 100 UNIT/ML Solostar Pen Inject 40 Units into the skin daily. Patient not taking: Reported on 12/22/2022 08/01/20   Burnadette Pop, MD  Insulin Pen Needle 32G X 4 MM MISC 1 each by Does not apply route 3 (three) times daily.  08/01/20   Burnadette Pop, MD  metFORMIN (GLUCOPHAGE) 500 MG tablet Take by mouth. 10/27/22   [provider]  OZEMPIC, 0.25 OR 0.5 MG/DOSE, 2 MG/3ML SOPN Inject into the skin. 12/17/22   [provider]  rosuvastatin (CRESTOR) 10 MG tablet Take 10 mg by mouth at bedtime.    [provider]  TURMERIC PO Take by mouth.    [provider]      Allergies    Patient has no known allergies.    Review of Systems   Review of Systems  Physical Exam Updated Vital Signs BP (!) 152/83 (BP Location: Right Arm)   Pulse 81   Temp 98.5 F (36.9 C)   Resp 17   Ht 6' (1.829 m)   Wt 108.9 kg   SpO2 97%   BMI 32.55 kg/m  Physical Exam Vitals and nursing note reviewed.  Constitutional:      General: He is not in acute distress.    Appearance: Normal appearance.  HENT:     Head: Normocephalic.  Cardiovascular:     Pulses: Normal pulses.  Musculoskeletal:       Legs:  Skin:    General: Skin is warm and dry.  Neurological:     Mental Status: He is alert. Mental status is at baseline.     ED Results / Procedures / Treatments   Labs (all labs ordered are listed, but only  abnormal results are displayed) Labs Reviewed - No data to display  EKG None  Radiology US Venous Img Lower Right (DVT Study)  Result Date: 08/25/2023 CLINICAL DATA:  Right lower extremity pain and edema for the past 2-3 weeks. Evaluate for DVT. EXAM: RIGHT LOWER EXTREMITY VENOUS DOPPLER ULTRASOUND TECHNIQUE: Gray-scale sonography with graded compression, as well as color Doppler and duplex ultrasound were performed to evaluate the lower extremity deep venous systems from the level of the common femoral vein and including the common femoral, femoral, profunda femoral, popliteal and calf veins including the posterior tibial, peroneal and gastrocnemius veins when visible. The superficial great saphenous vein was also interrogated. Spectral Doppler was utilized to evaluate flow at rest  and with distal augmentation maneuvers in the common femoral, femoral and popliteal veins. COMPARISON:  None Available. FINDINGS: Contralateral Common Femoral Vein: Respiratory phasicity is normal and symmetric with the symptomatic side. No evidence of thrombus. Normal compressibility. Common Femoral Vein: No evidence of thrombus. Normal compressibility, respiratory phasicity and response to augmentation. Saphenofemoral Junction: No evidence of thrombus. Normal compressibility and flow on color Doppler imaging. Profunda Femoral Vein: No evidence of thrombus. Normal compressibility and flow on color Doppler imaging. Femoral Vein: No evidence of thrombus. Normal compressibility, respiratory phasicity and response to augmentation. Popliteal Vein: No evidence of thrombus. Normal compressibility, respiratory phasicity and response to augmentation. Calf Veins: No evidence of thrombus. Normal compressibility and flow on color Doppler imaging. Superficial Great Saphenous Vein: No evidence of thrombus. Normal compressibility. Other Findings:  None. IMPRESSION: No evidence of DVT within the right lower extremity. Electronically Signed   By: Simonne Come M.D.   On: 08/25/2023 17:14    Procedures Procedures    Medications Ordered in ED Medications - No data to display  ED Course/ Medical Decision Making/ A&P                                 Medical Decision Making  Patient here to rule out DVT.  Patient appears to have a small hematoma or cyst present on the lateral portion of his right lower leg.  No evidence to suggest cellulitis.  Patient does not take any anticoagulation.  I have independently visualized and interpreted pt's images today. Ultrasound was negative for DVT.  Radiology reports no acute findings.  This was discussed with the patient.  Will have him continue to keep an eye on it suspect it will resolve spontaneously but gave him follow-up if it gets worse.        Final Clinical Impression(s)  / ED Diagnoses Final diagnoses:  Hematoma of right lower leg    Rx / DC Orders ED Discharge Orders     None         Gwyneth Sprout, MD 08/25/23 1809

## 2023-08-25 NOTE — ED Notes (Signed)
Reviewed AVS/discharge instruction with patient. Time allotted for and all questions answered. Patient is agreeable for d/c and escorted to ed exit by staff.  

## 2023-11-06 ENCOUNTER — Ambulatory Visit
Admission: EM | Admit: 2023-11-06 | Discharge: 2023-11-06 | Disposition: A | Discharge status: Home / Self Care | Payer: PPO | Attending: Emergency Medicine | Admitting: Emergency Medicine

## 2023-11-06 DIAGNOSIS — J069 Acute upper respiratory infection, unspecified: Secondary | ICD-10-CM

## 2023-11-06 MED ORDER — AMOXICILLIN-POT CLAVULANATE 875-125 MG PO TABS: 1.0000 | ORAL_TABLET | Freq: Two times a day (BID) | ORAL | 0 refills | Status: DC

## 2023-11-06 NOTE — Discharge Instructions (Signed)
Your symptoms today are most likely being caused by a virus and should steadily improve in time it can take up to 7 to 10 days before you truly start to see a turnaround however things will get better if no improvement seen by Friday you may pick up Augmentin from the pharmacy    You can take Tylenol and/or Ibuprofen as needed for fever reduction and pain relief.   For cough: honey 1/2 to 1 teaspoon (you can dilute the honey in water or another fluid).  You can also use guaifenesin and dextromethorphan for cough. You can use a humidifier for chest congestion and cough.  If you don't have a humidifier, you can sit in the bathroom with the hot shower running.      For sore throat: try warm salt water gargles, cepacol lozenges, throat spray, warm tea or water with lemon/honey, popsicles or ice, or OTC cold relief medicine for throat discomfort.   For congestion: take a daily anti-histamine like Zyrtec, Claritin, and a oral decongestant, such as pseudoephedrine.  You can also use Flonase 1-2 sprays in each nostril daily.   It is important to stay hydrated: drink plenty of fluids (water, gatorade/powerade/pedialyte, juices, or teas) to keep your throat moisturized and help further relieve irritation/discomfort.

## 2023-11-06 NOTE — ED Triage Notes (Signed)
Triage per provider

## 2023-11-07 ENCOUNTER — Ambulatory Visit: Payer: Self-pay

## 2023-11-07 NOTE — ED Provider Notes (Signed)
Paul Estes    CSN: [id number] Arrival date & time: 11/06/23  1425      History   Chief Complaint Chief Complaint  Patient presents with   Wheezing    HPI Paul Estes is a 68 y.o. adult.   Patient presents for evaluation of a productive cough, chest congestion present for 4 days.  Endorses crackling in the chest overnight for the past 2 days, has become more prominent.  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  Tolerating food and liquids.  Has attempted use of guaifenesin.  Denies fever, shortness of breath or wheezing.  Past Medical History:  Diagnosis Date   ALLERGIC RHINITIS 09/08/2010   Qualifier: Diagnosis of  By: Rita Ohara     Cancer [facility])    skin, sees Dr. Karlyn Agee    Diabetes mellitus without complication (HCC)    OSTEOARTHRITIS 09/08/2010   Qualifier: Diagnosis of  By: Rita Ohara     Sciatica     Patient Active Problem List   Diagnosis Date Noted   Abnormal CT of the abdomen 07/26/2020   Flank pain 07/26/2020   Allergic rhinitis 09/08/2010   Osteoarthritis 09/08/2010    Past Surgical History:  Procedure Laterality Date   BASAL CELL CARCINOMA EXCISION     IR RADIOLOGIST EVAL & MGMT  08/28/2020   IR SINUS/FIST TUBE CHK-NON GI  08/16/2020   NASAL SEPTUM SURGERY     RADIAL HEAD EXCISION     SQUAMOUS CELL CARCINOMA EXCISION  2013   from right ear per Dr. Yetta Barre     OB History   No obstetric history on file.      Home Medications    Prior to Admission medications   Medication Sig Start Date End Date Taking? Authorizing Provider  amoxicillin-clavulanate (AUGMENTIN) 875-125 MG tablet Take 1 tablet by mouth every 12 (twelve) hours. 11/12/23  Yes Roderic Lammert, Elita Boone, NP  blood glucose meter kit and supplies Dispense based on patient and insurance preference. Use up to four times daily as directed. (FOR ICD-10 E10.9, E11.9). 08/01/20   Burnadette Pop, MD  ferrous sulfate 325 (65 FE) MG tablet Take 1 tablet (325 mg total) by mouth  daily. 08/01/20 08/01/21  Burnadette Pop, MD  fluticasone (FLONASE) 50 MCG/ACT nasal spray Place into both nostrils daily.    [provider]  ibuprofen (ADVIL) 800 MG tablet Take 1 tablet (800 mg total) by mouth every 8 (eight) hours as needed. 08/01/20   Burnadette Pop, MD  insulin aspart (NOVOLOG FLEXPEN) 100 UNIT/ML FlexPen Inject 8 Units into the skin 3 (three) times daily with meals. Patient not taking: Reported on 12/22/2022 08/01/20   Burnadette Pop, MD  insulin glargine (LANTUS SOLOSTAR) 100 UNIT/ML Solostar Pen Inject 40 Units into the skin daily. Patient not taking: Reported on 12/22/2022 08/01/20   Burnadette Pop, MD  Insulin Pen Needle 32G X 4 MM MISC 1 each by Does not apply route 3 (three) times daily. 08/01/20   Burnadette Pop, MD  metFORMIN (GLUCOPHAGE) 500 MG tablet Take by mouth. 10/27/22   [provider]  OZEMPIC, 0.25 OR 0.5 MG/DOSE, 2 MG/3ML SOPN Inject into the skin. 12/17/22   [provider]  rosuvastatin (CRESTOR) 10 MG tablet Take 10 mg by mouth at bedtime.    [provider]  TURMERIC PO Take by mouth.    [provider]    Family History Family History  Problem Relation Age of Onset   Cancer Mother  Parkinsonism Father    Arthritis Neg Hx        family hx   Diabetes Neg Hx        family hx    Social History Social History   Tobacco Use   Smoking status: Never   Smokeless tobacco: Never  Vaping Use   Vaping status: Never Used  Substance Use Topics   Alcohol use: No   Drug use: No     Allergies   Patient has no known allergies.   Review of Systems Review of Systems  Respiratory:  Positive for wheezing.      Physical Exam Triage Vital Signs ED Triage Vitals  Encounter Vitals Group     BP 11/06/23 1529 (!) 153/77     Systolic BP Percentile --      Diastolic BP Percentile --      Pulse Rate 11/06/23 1529 76     Resp 11/06/23 1529 16     Temp 11/06/23 1529 99.2 F (37.3 C)     Temp  Source 11/06/23 1529 Oral     SpO2 11/06/23 1529 97 %     Weight --      Height --      Head Circumference --      Peak Flow --      Pain Score 11/06/23 1531 0     Pain Loc --      Pain Education --      Exclude from Growth Chart --    No data found.  Updated Vital Signs BP (!) 153/77 (BP Location: Right Arm)   Pulse 76   Temp 99.2 F (37.3 C) (Oral)   Resp 16   SpO2 97%   Visual Acuity Right Eye Distance:   Left Eye Distance:   Bilateral Distance:    Right Eye Near:   Left Eye Near:    Bilateral Near:     Physical Exam Constitutional:      Appearance: Normal appearance.  HENT:     Head: Normocephalic.     Right Ear: Tympanic membrane, ear canal and external ear normal.     Left Ear: Tympanic membrane, ear canal and external ear normal.     Nose: Nose normal.     Mouth/Throat:     Mouth: Mucous membranes are moist.     Pharynx: Oropharynx is clear.  Eyes:     Extraocular Movements: Extraocular movements intact.  Cardiovascular:     Rate and Rhythm: Normal rate and regular rhythm.     Heart sounds: Normal heart sounds.  Pulmonary:     Effort: Pulmonary effort is normal.     Comments: Mild expiratory wheeze to the left upper lobe, remaining lobes are clear Neurological:     Mental Status: He is alert and oriented to person, place, and time. Mental status is at baseline.      UC Treatments / Results  Labs (all labs ordered are listed, but only abnormal results are displayed) Labs Reviewed - No data to display  EKG   Radiology No results found.  Procedures Procedures (including critical care time)  Medications Ordered in UC Medications - No data to display  Initial Impression / Assessment and Plan / UC Course  I have reviewed the triage vital signs and the nursing notes.  Pertinent labs & imaging results that were available during my care of the patient were reviewed by me and considered in my medical decision making (see chart for  details).  Viral URI  with cough  Patient is in no signs of distress nor toxic appearing.  Vital signs are stable.  Low suspicion for pneumonia, pneumothorax or bronchitis and therefore will defer imaging.  declined steroids.May use additional over-the-counter medications as needed for supportive care.  May follow-up with urgent care as needed if symptoms persist or worsen.   Final Clinical Impressions(s) / UC Diagnoses   Final diagnoses:  Viral URI with cough     Discharge Instructions      Your symptoms today are most likely being caused by a virus and should steadily improve in time it can take up to 7 to 10 days before you truly start to see a turnaround however things will get better if no improvement seen by Friday you may pick up Augmentin from the pharmacy    You can take Tylenol and/or Ibuprofen as needed for fever reduction and pain relief.   For cough: honey 1/2 to 1 teaspoon (you can dilute the honey in water or another fluid).  You can also use guaifenesin and dextromethorphan for cough. You can use a humidifier for chest congestion and cough.  If you don't have a humidifier, you can sit in the bathroom with the hot shower running.      For sore throat: try warm salt water gargles, cepacol lozenges, throat spray, warm tea or water with lemon/honey, popsicles or ice, or OTC cold relief medicine for throat discomfort.   For congestion: take a daily anti-histamine like Zyrtec, Claritin, and a oral decongestant, such as pseudoephedrine.  You can also use Flonase 1-2 sprays in each nostril daily.   It is important to stay hydrated: drink plenty of fluids (water, gatorade/powerade/pedialyte, juices, or teas) to keep your throat moisturized and help further relieve irritation/discomfort.    ED Prescriptions     Medication Sig Dispense Auth. Provider   amoxicillin-clavulanate (AUGMENTIN) 875-125 MG tablet Take 1 tablet by mouth every 12 (twelve) hours. 14 tablet Yeraldi Fidler, Elita Boone, NP      PDMP not reviewed this encounter.   Valinda Hoar, [place] 11/07/23 [phone]

## 2023-11-22 ENCOUNTER — Ambulatory Visit: Payer: PPO | Admitting: Podiatry

## 2023-11-22 ENCOUNTER — Encounter: Payer: Self-pay | Admitting: Podiatry

## 2023-11-22 DIAGNOSIS — Q828 Other specified congenital malformations of skin: Secondary | ICD-10-CM | POA: Diagnosis not present

## 2023-11-22 NOTE — Progress Notes (Signed)
  Subjective:  Patient ID: Paul Estes, male    DOB: 1955-10-07,  MRN: [id number]  Chief Complaint  Patient presents with   Callouses    "I've got a callus that has gotten painful on my left foot."    68 y.o. male presents with the above complaint. History confirmed with patient.   Objective:  Physical Exam: warm, good capillary refill, no trophic changes or ulcerative lesions, normal DP and PT pulses, normal sensory exam, and mild pain on a porokeratosis submetatarsal 5 left foot, slight tailor's bunion deformity  Assessment:   1. Porokeratosis      Plan:  Patient was evaluated and treated and all questions answered.   All symptomatic hyperkeratoses were safely debrided with a sterile #15 blade to patient's level of comfort without incident. We discussed preventative and palliative care of these lesions including supportive and accommodative shoegear, padding, prefabricated and custom molded accommodative orthoses, use of a pumice stone and lotions/creams daily.  Discussed using salicylic acid cream as needed.  Salinocaine ointment applied today.   Return if symptoms worsen or fail to improve.

## 2023-11-22 NOTE — Patient Instructions (Signed)
Look for salicylic acid 40% cream medicated pads or ointment and apply to the thickened dry skin / calluses. This can be bought over the counter, at a pharmacy or online such as [employer], use these if the spot comes back  Remove the bandaid in 24 hours and wash with soap and water

## 2024-02-15 ENCOUNTER — Ambulatory Visit
Admission: RE | Admit: 2024-02-15 | Discharge: 2024-02-15 | Disposition: A | Discharge status: Home / Self Care | Source: Ambulatory Visit | Attending: Emergency Medicine | Admitting: Emergency Medicine

## 2024-02-15 ENCOUNTER — Ambulatory Visit
Admission: RE | Admit: 2024-02-15 | Discharge: 2024-02-15 | Disposition: A | Discharge status: Home / Self Care | Attending: Emergency Medicine | Admitting: Emergency Medicine

## 2024-02-15 VITALS — BP 145/76 | HR 80 | Temp 99.9°F | Resp 16 | Ht 72.0 in | Wt 250.0 lb

## 2024-02-15 DIAGNOSIS — R058 Other specified cough: Secondary | ICD-10-CM | POA: Diagnosis not present

## 2024-02-15 DIAGNOSIS — J189 Pneumonia, unspecified organism: Secondary | ICD-10-CM

## 2024-02-15 LAB — POC SARS CORONAVIRUS 2 AG -  ED: SARS Coronavirus 2 Ag: NEGATIVE

## 2024-02-15 MED ORDER — AMOXICILLIN-POT CLAVULANATE 875-125 MG PO TABS: 1.0000 | ORAL_TABLET | Freq: Two times a day (BID) | ORAL | 0 refills | Status: DC

## 2024-02-15 MED ORDER — AZITHROMYCIN 250 MG PO TABS: 250.0000 mg | ORAL_TABLET | Freq: Every day | ORAL | 0 refills | Status: DC

## 2024-02-15 NOTE — Discharge Instructions (Addendum)
 Go to [facility] for your x-ray.    [address] [facility] [address]  [address] [place], [place] [address] [phone]  I will call you with the results.  Take the Zithromax  and Augmentin  as directed.   Follow up with your primary care provider tomorrow.  Go to the emergency department if you have worsening symptoms.

## 2024-02-15 NOTE — ED Triage Notes (Signed)
 Pt c/o cough & congestion x2 days. Has tried mucinex & tylenol  last dose around 1100.

## 2024-02-15 NOTE — ED Provider Notes (Signed)
 Paul Estes    CSN: [id number] Arrival date & time: 02/15/24  1454      History   Chief Complaint Chief Complaint  Patient presents with   Cough    HPI Paul Estes is a 68 y.o. male.  Patient presents with 2-day history of congestion and cough.  His cough is productive and he hears rattling in his chest.  He has been treating his symptoms with Tylenol  and Mucinex; last taken at 1100 this morning.  He reports no fever or shortness of breath.  Patient was seen at this urgent care on 11/06/2023; diagnosed with viral URI with cough; Augmentin  prescribed but patient states he did not take this medication.  The history is provided by the patient and medical records.    Past Medical History:  Diagnosis Date   ALLERGIC RHINITIS 09/08/2010   Qualifier: Diagnosis of  By: Carlyn Childs     Cancer [facility])    skin, sees Dr. Marcia Setters    Diabetes mellitus without complication (HCC)    OSTEOARTHRITIS 09/08/2010   Qualifier: Diagnosis of  By: Carlyn Childs     Sciatica     Patient Active Problem List   Diagnosis Date Noted   Abnormal CT of the abdomen 07/26/2020   Flank pain 07/26/2020   Allergic rhinitis 09/08/2010   Osteoarthritis 09/08/2010    Past Surgical History:  Procedure Laterality Date   BASAL CELL CARCINOMA EXCISION     IR RADIOLOGIST EVAL & MGMT  08/28/2020   IR SINUS/FIST TUBE CHK-NON GI  08/16/2020   NASAL SEPTUM SURGERY     RADIAL HEAD EXCISION     SQUAMOUS CELL CARCINOMA EXCISION  2013   from right ear per Dr. Rochelle Chu        Home Medications    Prior to Admission medications   Medication Sig Start Date End Date Taking? Authorizing Provider  amoxicillin -clavulanate (AUGMENTIN ) 875-125 MG tablet Take 1 tablet by mouth every 12 (twelve) hours. 02/15/24  Yes Wellington Half, NP  azithromycin  (ZITHROMAX ) 250 MG tablet Take 1 tablet (250 mg total) by mouth daily. Take first 2 tablets together, then 1 every day until finished. 02/15/24  Yes Wellington Half, NP   blood glucose meter kit and supplies Dispense based on patient and insurance preference. Use up to four times daily as directed. (FOR ICD-10 E10.9, E11.9). 08/01/20  Yes Adhikari, Amrit, MD  fluticasone (FLONASE) 50 MCG/ACT nasal spray Place into both nostrils daily.   Yes [provider]  ibuprofen  (ADVIL ) 800 MG tablet Take 1 tablet (800 mg total) by mouth every 8 (eight) hours as needed. 08/01/20  Yes Leona Rake, MD  Insulin  Pen Needle 32G X 4 MM MISC 1 each by Does not apply route 3 (three) times daily. 08/01/20  Yes Adhikari, Amrit, MD  metFORMIN (GLUCOPHAGE) 500 MG tablet Take by mouth. 10/27/22  Yes [provider]  OZEMPIC, 0.25 OR 0.5 MG/DOSE, 2 MG/3ML SOPN Inject into the skin. 12/17/22  Yes [provider]  rosuvastatin (CRESTOR) 10 MG tablet Take 10 mg by mouth at bedtime.   Yes [provider]  TURMERIC PO Take by mouth.   Yes [provider]  ferrous sulfate  325 (65 FE) MG tablet Take 1 tablet (325 mg total) by mouth daily. 08/01/20 08/01/21  Adhikari, Amrit, MD  insulin  aspart (NOVOLOG  FLEXPEN) 100 UNIT/ML FlexPen Inject 8 Units into the skin 3 (three) times daily with meals. Patient not taking: Reported on 12/22/2022 08/01/20   Adhikari, Amrit,  MD  insulin  glargine (LANTUS  SOLOSTAR) 100 UNIT/ML Solostar Pen Inject 40 Units into the skin daily. Patient not taking: Reported on 12/22/2022 08/01/20   Leona Rake, MD    Family History Family History  Problem Relation Age of Onset   Cancer Mother    Parkinsonism Father    Arthritis Neg Hx        family hx   Diabetes Neg Hx        family hx    Social History Social History   Tobacco Use   Smoking status: Never   Smokeless tobacco: Never  Vaping Use   Vaping status: Never Used  Substance Use Topics   Alcohol use: No   Drug use: No     Allergies   Patient has no known allergies.   Review of Systems Review of Systems  Constitutional:  Negative for chills and  fever.  HENT:  Positive for congestion and sore throat. Negative for ear pain.   Respiratory:  Positive for cough. Negative for shortness of breath.      Physical Exam Triage Vital Signs ED Triage Vitals  Encounter Vitals Group     BP      Systolic BP Percentile      Diastolic BP Percentile      Pulse      Resp      Temp      Temp src      SpO2      Weight      Height      Head Circumference      Peak Flow      Pain Score      Pain Loc      Pain Education      Exclude from Growth Chart    No data found.  Updated Vital Signs BP (!) 145/76 (BP Location: Left Arm)   Pulse 80   Temp 99.9 F (37.7 C) (Oral)   Resp 16   Ht 6' (1.829 m)   Wt 250 lb (113.4 kg)   SpO2 94%   BMI 33.91 kg/m   Visual Acuity Right Eye Distance:   Left Eye Distance:   Bilateral Distance:    Right Eye Near:   Left Eye Near:    Bilateral Near:     Physical Exam Constitutional:      General: He is not in acute distress. HENT:     Right Ear: Tympanic membrane normal.     Left Ear: Tympanic membrane normal.     Nose: Nose normal.     Mouth/Throat:     Mouth: Mucous membranes are moist.     Pharynx: Oropharynx is clear.  Cardiovascular:     Rate and Rhythm: Normal rate and regular rhythm.     Heart sounds: Normal heart sounds.  Pulmonary:     Effort: Pulmonary effort is normal. No respiratory distress.     Breath sounds: Rhonchi present.     Comments: Right side rhonchi. Neurological:     Mental Status: He is alert.      UC Treatments / Results  Labs (all labs ordered are listed, but only abnormal results are displayed) Labs Reviewed  POC SARS CORONAVIRUS 2 AG -  ED    EKG   Radiology DG Chest 2 View Result Date: 02/15/2024 CLINICAL DATA:  Productive cough EXAM: CHEST - 2 VIEW COMPARISON:  07/13/2020 FINDINGS: There are patchy airspace opacities at the right lung base. Left lung clear. Heart size and mediastinal contours are  within normal limits. No effusion. Vertebral  endplate spurring at multiple levels in the lower thoracic spine. IMPRESSION: Right basilar airspace disease. Electronically Signed   By: Nicoletta Barrier M.D.   On: 02/15/2024 16:16    Procedures Procedures (including critical care time)  Medications Ordered in UC Medications - No data to display  Initial Impression / Assessment and Plan / UC Course  I have reviewed the triage vital signs and the nursing notes.  Pertinent labs & imaging results that were available during my care of the patient were reviewed by me and considered in my medical decision making (see chart for details).   Productive cough, Right lower lobe pneumonia.  CXR shows "patchy airspace opacities at the right lung base."  Treating with Zithromax  and Augmentin .  Instructed patient to follow-up with his PCP tomorrow.  ED precautions given.  Education provided on pneumonia.  He agrees to plan of care.  Final Clinical Impressions(s) / UC Diagnoses   Final diagnoses:  Productive cough  Pneumonia of right lower lobe due to infectious organism     Discharge Instructions      Go to [facility] for your x-ray.    [address] [facility] [address]  [address] [place], [place] [address] [phone]  I will call you with the results.  Take the Zithromax  and Augmentin  as directed.   Follow up with your primary care provider tomorrow.  Go to the emergency department if you have worsening symptoms.      ED Prescriptions     Medication Sig Dispense Auth. Provider   azithromycin  (ZITHROMAX ) 250 MG tablet Take 1 tablet (250 mg total) by mouth daily. Take first 2 tablets together, then 1 every day until finished. 6 tablet Wellington Half, NP   amoxicillin -clavulanate (AUGMENTIN ) 875-125 MG tablet Take 1 tablet by mouth every 12 (twelve) hours. 14 tablet Wellington Half, NP      PDMP not reviewed this encounter.   Wellington Half, NP 02/15/24 [phone]

## 2024-02-16 ENCOUNTER — Ambulatory Visit (HOSPITAL_COMMUNITY): Payer: Self-pay

## 2024-05-17 ENCOUNTER — Ambulatory Visit
Admission: EM | Admit: 2024-05-17 | Discharge: 2024-05-17 | Disposition: A | Discharge status: Home / Self Care | Attending: Emergency Medicine | Admitting: Emergency Medicine

## 2024-05-17 DIAGNOSIS — L03113 Cellulitis of right upper limb: Secondary | ICD-10-CM

## 2024-05-17 DIAGNOSIS — W57XXXA Bitten or stung by nonvenomous insect and other nonvenomous arthropods, initial encounter: Secondary | ICD-10-CM

## 2024-05-17 DIAGNOSIS — S50861A Insect bite (nonvenomous) of right forearm, initial encounter: Secondary | ICD-10-CM | POA: Diagnosis not present

## 2024-05-17 MED ORDER — DOXYCYCLINE HYCLATE 100 MG PO CAPS: 100.0000 mg | ORAL_CAPSULE | Freq: Two times a day (BID) | ORAL | 0 refills | Status: AC

## 2024-05-17 NOTE — Discharge Instructions (Addendum)
Take the doxycycline as directed.  Follow up with your primary care provider tomorrow.  Go to the emergency department if you have worsening symptoms.

## 2024-05-17 NOTE — ED Provider Notes (Signed)
 Paul Estes    CSN: [id number] Arrival date & time: 05/17/24  1618      History   Chief Complaint Chief Complaint  Patient presents with   Insect Bite    HPI Paul Estes is a 68 y.o. male.  Patient presents with 2 insect bites and surrounding swelling and redness of his right forearm since yesterday.  No fever or purulent drainage.  The redness has been spreading.  The history is provided by the patient and medical records.    Past Medical History:  Diagnosis Date   ALLERGIC RHINITIS 09/08/2010   Qualifier: Diagnosis of  By: Arbutus Noon     Cancer [facility])    skin, sees Dr. Rolan Molt    Diabetes mellitus without complication (HCC)    OSTEOARTHRITIS 09/08/2010   Qualifier: Diagnosis of  By: Arbutus Noon     Sciatica     Patient Active Problem List   Diagnosis Date Noted   Abnormal CT of the abdomen 07/26/2020   Flank pain 07/26/2020   Allergic rhinitis 09/08/2010   Osteoarthritis 09/08/2010    Past Surgical History:  Procedure Laterality Date   BASAL CELL CARCINOMA EXCISION     IR RADIOLOGIST EVAL & MGMT  08/28/2020   IR SINUS/FIST TUBE CHK-NON GI  08/16/2020   NASAL SEPTUM SURGERY     RADIAL HEAD EXCISION     SQUAMOUS CELL CARCINOMA EXCISION  2013   from right ear per Dr. Molt        Home Medications    Prior to Admission medications   Medication Sig Start Date End Date Taking? Authorizing Provider  doxycycline  (VIBRAMYCIN ) 100 MG capsule Take 1 capsule (100 mg total) by mouth 2 (two) times daily for 7 days. 05/17/24 05/24/24 Yes Corlis Burnard DEL, NP  blood glucose meter kit and supplies Dispense based on patient and insurance preference. Use up to four times daily as directed. (FOR ICD-10 E10.9, E11.9). 08/01/20   Jillian Buttery, MD  ferrous sulfate  325 (65 FE) MG tablet Take 1 tablet (325 mg total) by mouth daily. 08/01/20 08/01/21  Adhikari, Amrit, MD  fluticasone (FLONASE) 50 MCG/ACT nasal spray Place into both nostrils daily.    [provider]  ibuprofen  (ADVIL ) 800 MG tablet Take 1 tablet (800 mg total) by mouth every 8 (eight) hours as needed. 08/01/20   Jillian Buttery, MD  insulin  aspart (NOVOLOG  FLEXPEN) 100 UNIT/ML FlexPen Inject 8 Units into the skin 3 (three) times daily with meals. Patient not taking: Reported on 12/22/2022 08/01/20   Adhikari, Amrit, MD  insulin  glargine (LANTUS  SOLOSTAR) 100 UNIT/ML Solostar Pen Inject 40 Units into the skin daily. Patient not taking: Reported on 12/22/2022 08/01/20   Jillian Buttery, MD  Insulin  Pen Needle 32G X 4 MM MISC 1 each by Does not apply route 3 (three) times daily. 08/01/20   Adhikari, Amrit, MD  metFORMIN (GLUCOPHAGE) 500 MG tablet Take by mouth. 10/27/22   [provider]  OZEMPIC, 0.25 OR 0.5 MG/DOSE, 2 MG/3ML SOPN Inject into the skin. 12/17/22   [provider]  rosuvastatin (CRESTOR) 10 MG tablet Take 10 mg by mouth at bedtime.    [provider]  TURMERIC PO Take by mouth.    [provider]    Family History Family History  Problem Relation Age of Onset   Cancer Mother    Parkinsonism Father    Arthritis Neg Hx        family hx   Diabetes Neg Hx  family hx    Social History Social History   Tobacco Use   Smoking status: Never   Smokeless tobacco: Never  Vaping Use   Vaping status: Never Used  Substance Use Topics   Alcohol use: No   Drug use: No     Allergies   Patient has no known allergies.   Review of Systems Review of Systems  Constitutional:  Negative for chills and fever.  Musculoskeletal:  Negative for arthralgias and joint swelling.  Skin:  Positive for color change and wound.  Neurological:  Negative for weakness and numbness.     Physical Exam Triage Vital Signs ED Triage Vitals  Encounter Vitals Group     BP      Girls Systolic BP Percentile      Girls Diastolic BP Percentile      Boys Systolic BP Percentile      Boys Diastolic BP Percentile      Pulse      Resp       Temp      Temp src      SpO2      Weight      Height      Head Circumference      Peak Flow      Pain Score      Pain Loc      Pain Education      Exclude from Growth Chart    No data found.  Updated Vital Signs BP (!) 143/82   Pulse 96   Temp 98.6 F (37 C)   Resp 18   SpO2 97%   Visual Acuity Right Eye Distance:   Left Eye Distance:   Bilateral Distance:    Right Eye Near:   Left Eye Near:    Bilateral Near:     Physical Exam Constitutional:      General: He is not in acute distress. HENT:     Mouth/Throat:     Mouth: Mucous membranes are moist.  Cardiovascular:     Rate and Rhythm: Normal rate and regular rhythm.  Pulmonary:     Effort: Pulmonary effort is normal. No respiratory distress.  Musculoskeletal:        General: No deformity. Normal range of motion.  Skin:    General: Skin is warm and dry.     Capillary Refill: Capillary refill takes less than 2 seconds.     Findings: Erythema and lesion present.     Comments: Generalized moderate edema of right forearm with 2 insect bites and erythema surrounding.  No drainage.  Neurological:     General: No focal deficit present.     Mental Status: He is alert.     Sensory: No sensory deficit.     Motor: No weakness.      UC Treatments / Results  Labs (all labs ordered are listed, but only abnormal results are displayed) Labs Reviewed - No data to display  EKG   Radiology No results found.  Procedures Procedures (including critical care time)  Medications Ordered in UC Medications - No data to display  Initial Impression / Assessment and Plan / UC Course  I have reviewed the triage vital signs and the nursing notes.  Pertinent labs & imaging results that were available during my care of the patient were reviewed by me and considered in my medical decision making (see chart for details).    Cellulitis and insect bites on right forearm.  Afebrile and vital signs are stable.  Treating with  doxycycline .  Education provided on cellulitis and insect bites.  Instructed patient to follow-up with his PCP tomorrow.  ED precautions given.  He agrees to plan of care.  Final Clinical Impressions(s) / UC Diagnoses   Final diagnoses:  Cellulitis of right forearm  Insect bite of right forearm, initial encounter     Discharge Instructions      Take the doxycycline  as directed.  Follow up with your primary care provider tomorrow.  Go to the emergency department if you have worsening symptoms.        ED Prescriptions     Medication Sig Dispense Auth. Provider   doxycycline  (VIBRAMYCIN ) 100 MG capsule Take 1 capsule (100 mg total) by mouth 2 (two) times daily for 7 days. 14 capsule Corlis Burnard DEL, NP      PDMP not reviewed this encounter.   Corlis Burnard DEL, NP 05/17/24 [phone]

## 2024-05-17 NOTE — ED Triage Notes (Signed)
 Patient to Urgent Care with complaints of insect bites to his right forearm. Two red and swollen/ tight areas.   Symptoms started yesterday.

## 2024-05-23 ENCOUNTER — Ambulatory Visit (LOCAL_COMMUNITY_HEALTH_CENTER): Payer: Self-pay

## 2024-05-23 ENCOUNTER — Other Ambulatory Visit: Payer: Self-pay

## 2024-05-23 DIAGNOSIS — Z111 Encounter for screening for respiratory tuberculosis: Secondary | ICD-10-CM

## 2024-05-23 DIAGNOSIS — Z23 Encounter for immunization: Secondary | ICD-10-CM | POA: Diagnosis not present

## 2024-05-23 DIAGNOSIS — Z719 Counseling, unspecified: Secondary | ICD-10-CM

## 2024-05-23 NOTE — Progress Notes (Signed)
 In nurse clinic for Hep B vaccine and PPD placement. Voices no concerns. VIS reviewed and given to patient. Vaccine (Heplisav B) tolerated well; no issues noted. [facility] updated and copy given to patient.   PPD placed successfully on L arm. Scheduled for PPDR 05/25/24 at 4PM.  Doyce CINDERELLA Shuck, RN

## 2024-05-25 ENCOUNTER — Ambulatory Visit: Payer: Self-pay

## 2024-05-25 ENCOUNTER — Ambulatory Visit (LOCAL_COMMUNITY_HEALTH_CENTER): Payer: Self-pay

## 2024-05-25 DIAGNOSIS — Z111 Encounter for screening for respiratory tuberculosis: Secondary | ICD-10-CM

## 2024-05-25 LAB — TB SKIN TEST
Induration: 0 mm
TB Skin Test: NEGATIVE
